# Patient Record
Sex: Male | Born: 1935 | Race: Black or African American | Hispanic: No | Marital: Married | Smoking: Former smoker
Health system: Southern US, Community
[De-identification: ages and names within clinical notes are randomized; demographics above are authoritative.]

## PROBLEM LIST (undated history)

## (undated) DIAGNOSIS — E119 Type 2 diabetes mellitus without complications: Secondary | ICD-10-CM

## (undated) DIAGNOSIS — I10 Essential (primary) hypertension: Secondary | ICD-10-CM

## (undated) DIAGNOSIS — E7800 Pure hypercholesterolemia, unspecified: Secondary | ICD-10-CM

## (undated) HISTORY — PX: CORONARY ANGIOPLASTY WITH STENT PLACEMENT: SHX49

## (undated) HISTORY — PX: EYE SURGERY: SHX253

---

## 2010-04-22 ENCOUNTER — Emergency Department (HOSPITAL_BASED_OUTPATIENT_CLINIC_OR_DEPARTMENT_OTHER): Admission: EM | Admit: 2010-04-22 | Discharge: 2010-04-22 | Payer: Self-pay | Admitting: Emergency Medicine

## 2010-04-22 ENCOUNTER — Ambulatory Visit: Payer: Self-pay | Admitting: Diagnostic Radiology

## 2010-05-14 ENCOUNTER — Ambulatory Visit (HOSPITAL_BASED_OUTPATIENT_CLINIC_OR_DEPARTMENT_OTHER): Admission: RE | Admit: 2010-05-14 | Discharge: 2010-05-14 | Payer: Self-pay | Admitting: Orthopedic Surgery

## 2010-05-14 ENCOUNTER — Ambulatory Visit: Payer: Self-pay | Admitting: Diagnostic Radiology

## 2012-08-17 ENCOUNTER — Encounter (HOSPITAL_BASED_OUTPATIENT_CLINIC_OR_DEPARTMENT_OTHER): Payer: Self-pay | Admitting: *Deleted

## 2012-08-17 ENCOUNTER — Emergency Department (HOSPITAL_BASED_OUTPATIENT_CLINIC_OR_DEPARTMENT_OTHER)
Admission: EM | Admit: 2012-08-17 | Discharge: 2012-08-17 | Disposition: A | Discharge status: Home / Self Care | Payer: Medicare Other | Attending: Emergency Medicine | Admitting: Emergency Medicine

## 2012-08-17 ENCOUNTER — Emergency Department (HOSPITAL_BASED_OUTPATIENT_CLINIC_OR_DEPARTMENT_OTHER): Payer: Medicare Other

## 2012-08-17 DIAGNOSIS — R519 Headache, unspecified: Secondary | ICD-10-CM

## 2012-08-17 DIAGNOSIS — R51 Headache: Secondary | ICD-10-CM | POA: Insufficient documentation

## 2012-08-17 DIAGNOSIS — I10 Essential (primary) hypertension: Secondary | ICD-10-CM | POA: Insufficient documentation

## 2012-08-17 HISTORY — DX: Essential (primary) hypertension: I10

## 2012-08-17 NOTE — ED Notes (Signed)
Pt reports pain on top of head since yesterday- denies injury- tender to touch- skin intact

## 2012-08-17 NOTE — ED Provider Notes (Signed)
History     CSN: [id number]  Arrival date & time 08/17/12  1854   First MD Initiated Contact with Patient 08/17/12 1947      Chief Complaint  Patient presents with  . Pain    (Consider location/radiation/quality/duration/timing/severity/associated sxs/prior treatment) HPI Comments: Patient presents complaining of pain to the top of the head that started yesterday.  He denies any injury or trauma.  There are no visual complaints.  No fevers or neck pain.  Has not tried anything for relief.  Never happened before.  The history is provided by the patient.    Past Medical History  Diagnosis Date  . Hypertension     History reviewed. No pertinent past surgical history.  No family history on file.  History  Substance Use Topics  . Smoking status: Former Games developer  . Smokeless tobacco: Never Used  . Alcohol Use: No     former      Review of Systems  All other systems reviewed and are negative.    Allergies  Review of patient's allergies indicates no known allergies.  Home Medications  No current outpatient prescriptions on file.  BP 167/72  Pulse 78  Temp 98.3 F (36.8 C) (Oral)  Resp 18  Ht 5\' 4"  (1.626 m)  Wt 178 lb (80.74 kg)  BMI 30.55 kg/m2  SpO2 98%  Physical Exam  Nursing note and vitals reviewed. Constitutional: He is oriented to person, place, and time. He appears well-developed and well-nourished. No distress.  HENT:  Head: Normocephalic and atraumatic.  Right Ear: External ear normal.  Left Ear: External ear normal.  Mouth/Throat: Oropharynx is clear and moist.       The patient is tender in the top of his head.  There is no temporal ttp and no visible abnormality as far as rash or erythema.  Eyes: EOM are normal. Pupils are equal, round, and reactive to light.  Neck: Normal range of motion. Neck supple.  Cardiovascular: Normal rate and regular rhythm.   No murmur heard. Pulmonary/Chest: Effort normal and breath sounds normal. No respiratory  distress.  Musculoskeletal: Normal range of motion.  Neurological: He is alert and oriented to person, place, and time. No cranial nerve deficit. Coordination normal.  Skin: Skin is warm and dry. He is not diaphoretic.    ED Course  Procedures (including critical care time)  Labs Reviewed - No data to display No results found.   No diagnosis found.    MDM  The ct looks okay.  I am unsure as to why he is having this discomfort.  Considered are SDH which I believe the ct has ruled out, pre-shingles eruption pain, and temporal arteritis, however he is not tender over the temporal artery, just the top of the head.  He will be discharged to home neurologically intact and give this time to improve or declare itself.          Geoffery Lyons, MD 08/17/12 2123

## 2015-04-11 ENCOUNTER — Encounter (HOSPITAL_BASED_OUTPATIENT_CLINIC_OR_DEPARTMENT_OTHER): Payer: Self-pay

## 2015-04-11 ENCOUNTER — Emergency Department (HOSPITAL_BASED_OUTPATIENT_CLINIC_OR_DEPARTMENT_OTHER): Payer: Medicare PPO

## 2015-04-11 ENCOUNTER — Emergency Department (HOSPITAL_BASED_OUTPATIENT_CLINIC_OR_DEPARTMENT_OTHER)
Admission: EM | Admit: 2015-04-11 | Discharge: 2015-04-11 | Disposition: A | Discharge status: Home / Self Care | Payer: Medicare PPO | Attending: Emergency Medicine | Admitting: Emergency Medicine

## 2015-04-11 DIAGNOSIS — I10 Essential (primary) hypertension: Secondary | ICD-10-CM | POA: Insufficient documentation

## 2015-04-11 DIAGNOSIS — M109 Gout, unspecified: Secondary | ICD-10-CM | POA: Diagnosis not present

## 2015-04-11 DIAGNOSIS — Z87891 Personal history of nicotine dependence: Secondary | ICD-10-CM | POA: Insufficient documentation

## 2015-04-11 DIAGNOSIS — Z9861 Coronary angioplasty status: Secondary | ICD-10-CM | POA: Diagnosis not present

## 2015-04-11 DIAGNOSIS — Z79899 Other long term (current) drug therapy: Secondary | ICD-10-CM | POA: Insufficient documentation

## 2015-04-11 DIAGNOSIS — E780 Pure hypercholesterolemia: Secondary | ICD-10-CM | POA: Diagnosis not present

## 2015-04-11 DIAGNOSIS — E119 Type 2 diabetes mellitus without complications: Secondary | ICD-10-CM | POA: Insufficient documentation

## 2015-04-11 DIAGNOSIS — M79672 Pain in left foot: Secondary | ICD-10-CM | POA: Diagnosis present

## 2015-04-11 HISTORY — DX: Pure hypercholesterolemia, unspecified: E78.00

## 2015-04-11 HISTORY — DX: Type 2 diabetes mellitus without complications: E11.9

## 2015-04-11 LAB — CBC WITH DIFFERENTIAL/PLATELET
Basophils Absolute: 0 10*3/uL (ref 0.0–0.1)
Basophils Relative: 1 % (ref 0–1)
Eosinophils Absolute: 0.5 10*3/uL (ref 0.0–0.7)
Eosinophils Relative: 6 % — ABNORMAL HIGH (ref 0–5)
HCT: 37.8 % — ABNORMAL LOW (ref 39.0–52.0)
Hemoglobin: 12.5 g/dL — ABNORMAL LOW (ref 13.0–17.0)
Lymphocytes Relative: 30 % (ref 12–46)
Lymphs Abs: 2.3 10*3/uL (ref 0.7–4.0)
MCH: 28.3 pg (ref 26.0–34.0)
MCHC: 33.1 g/dL (ref 30.0–36.0)
MCV: 85.7 fL (ref 78.0–100.0)
Monocytes Absolute: 0.8 10*3/uL (ref 0.1–1.0)
Monocytes Relative: 10 % (ref 3–12)
Neutro Abs: 4 10*3/uL (ref 1.7–7.7)
Neutrophils Relative %: 53 % (ref 43–77)
Platelets: 137 10*3/uL — ABNORMAL LOW (ref 150–400)
RBC: 4.41 MIL/uL (ref 4.22–5.81)
RDW: 14.1 % (ref 11.5–15.5)
WBC: 7.7 10*3/uL (ref 4.0–10.5)

## 2015-04-11 LAB — BASIC METABOLIC PANEL
Anion gap: 9 (ref 5–15)
BUN: 29 mg/dL — ABNORMAL HIGH (ref 6–23)
CO2: 22 mmol/L (ref 19–32)
Calcium: 8.2 mg/dL — ABNORMAL LOW (ref 8.4–10.5)
Chloride: 106 mmol/L (ref 96–112)
Creatinine, Ser: 1.78 mg/dL — ABNORMAL HIGH (ref 0.50–1.35)
GFR calc Af Amer: 40 mL/min — ABNORMAL LOW (ref >90)
GFR calc non Af Amer: 35 mL/min — ABNORMAL LOW (ref >90)
Glucose, Bld: 217 mg/dL — ABNORMAL HIGH (ref 70–99)
Potassium: 3.7 mmol/L (ref 3.5–5.1)
Sodium: 137 mmol/L (ref 135–145)

## 2015-04-11 MED ORDER — COLCHICINE 0.6 MG PO TABS: 0.6000 mg | ORAL_TABLET | Freq: Every day | ORAL | Status: DC

## 2015-04-11 MED ORDER — HYDROCODONE-ACETAMINOPHEN 5-325 MG PO TABS
1.0000 | ORAL_TABLET | Freq: Once | ORAL | Status: AC
  Administered 2015-04-11: 1 via ORAL
  Filled 2015-04-11: qty 1

## 2015-04-11 MED ORDER — HYDROCODONE-ACETAMINOPHEN 5-325 MG PO TABS: 1.0000 | ORAL_TABLET | Freq: Four times a day (QID) | ORAL | Status: DC | PRN

## 2015-04-11 NOTE — Discharge Instructions (Signed)
Your Creatinine (measure of kidney function) is 1.78 (elevated). Call your doctor for close follow up to recheck this. The most likely cause of your foot pain is gout, but if this gets worse or swelling worsens it may also be an infection and you should return to the ER immediately     Gout Gout is an inflammatory arthritis caused by a buildup of uric acid crystals in the joints. Uric acid is a chemical that is normally present in the blood. When the level of uric acid in the blood is too high it can form crystals that deposit in your joints and tissues. This causes joint redness, soreness, and swelling (inflammation). Repeat attacks are common. Over time, uric acid crystals can form into masses (tophi) near a joint, destroying bone and causing disfigurement. Gout is treatable and often preventable. CAUSES  The disease begins with elevated levels of uric acid in the blood. Uric acid is produced by your body when it breaks down a naturally found substance called purines. Certain foods you eat, such as meats and fish, contain high amounts of purines. Causes of an elevated uric acid level include:  Being passed down from parent to child (heredity).  Diseases that cause increased uric acid production (such as obesity, psoriasis, and certain cancers).  Excessive alcohol use.  Diet, especially diets rich in meat and seafood.  Medicines, including certain cancer-fighting medicines (chemotherapy), water pills (diuretics), and aspirin.  Chronic kidney disease. The kidneys are no longer able to remove uric acid well.  Problems with metabolism. Conditions strongly associated with gout include:  Obesity.  High blood pressure.  High cholesterol.  Diabetes. Not everyone with elevated uric acid levels gets gout. It is not understood why some people get gout and others do not. Surgery, joint injury, and eating too much of certain foods are some of the factors that can lead to gout attacks. SYMPTOMS     An attack of gout comes on quickly. It causes intense pain with redness, swelling, and warmth in a joint.  Fever can occur.  Often, only one joint is involved. Certain joints are more commonly involved:  Base of the big toe.  Knee.  Ankle.  Wrist.  Finger. Without treatment, an attack usually goes away in a few days to weeks. Between attacks, you usually will not have symptoms, which is different from many other forms of arthritis. DIAGNOSIS  Your caregiver will suspect gout based on your symptoms and exam. In some cases, tests may be recommended. The tests may include:  Blood tests.  Urine tests.  X-rays.  Joint fluid exam. This exam requires a needle to remove fluid from the joint (arthrocentesis). Using a microscope, gout is confirmed when uric acid crystals are seen in the joint fluid. TREATMENT  There are two phases to gout treatment: treating the sudden onset (acute) attack and preventing attacks (prophylaxis).  Treatment of an Acute Attack.  Medicines are used. These include anti-inflammatory medicines or steroid medicines.  An injection of steroid medicine into the affected joint is sometimes necessary.  The painful joint is rested. Movement can worsen the arthritis.  You may use warm or cold treatments on painful joints, depending which works best for you.  Treatment to Prevent Attacks.  If you suffer from frequent gout attacks, your caregiver may advise preventive medicine. These medicines are started after the acute attack subsides. These medicines either help your kidneys eliminate uric acid from your body or decrease your uric acid production. You may need to  stay on these medicines for a very long time.  The early phase of treatment with preventive medicine can be associated with an increase in acute gout attacks. For this reason, during the first few months of treatment, your caregiver may also advise you to take medicines usually used for acute gout  treatment. Be sure you understand your caregiver's directions. Your caregiver may make several adjustments to your medicine dose before these medicines are effective.  Discuss dietary treatment with your caregiver or dietitian. Alcohol and drinks high in sugar and fructose and foods such as meat, poultry, and seafood can increase uric acid levels. Your caregiver or dietitian can advise you on drinks and foods that should be limited. HOME CARE INSTRUCTIONS   Do not take aspirin to relieve pain. This raises uric acid levels.  Only take over-the-counter or prescription medicines for pain, discomfort, or fever as directed by your caregiver.  Rest the joint as much as possible. When in bed, keep sheets and blankets off painful areas.  Keep the affected joint raised (elevated).  Apply warm or cold treatments to painful joints. Use of warm or cold treatments depends on which works best for you.  Use crutches if the painful joint is in your leg.  Drink enough fluids to keep your urine clear or pale yellow. This helps your body get rid of uric acid. Limit alcohol, sugary drinks, and fructose drinks.  Follow your dietary instructions. Pay careful attention to the amount of protein you eat. Your daily diet should emphasize fruits, vegetables, whole grains, and fat-free or low-fat milk products. Discuss the use of coffee, vitamin C, and cherries with your caregiver or dietitian. These may be helpful in lowering uric acid levels.  Maintain a healthy body weight. SEEK MEDICAL CARE IF:   You develop diarrhea, vomiting, or any side effects from medicines.  You do not feel better in 24 hours, or you are getting worse. SEEK IMMEDIATE MEDICAL CARE IF:   Your joint becomes suddenly more tender, and you have chills or a fever. MAKE SURE YOU:   Understand these instructions.  Will watch your condition.  Will get help right away if you are not doing well or get worse. Document Released: 12/11/2000  Document Revised: 04/30/2014 Document Reviewed: 07/27/2012 [facility] Patient Information 2015 [place], [place]. This information is not intended to replace advice given to you by your health care provider. Make sure you discuss any questions you have with your health care provider.

## 2015-04-11 NOTE — ED Notes (Signed)
Left foot pain x today-denies injury-slow steady gait into door-places in w/c

## 2015-04-11 NOTE — ED Provider Notes (Signed)
CSN: [id number]     Arrival date & time 04/11/15  1905 History  This chart was scribed for Pricilla LovelessScott Woods Gangemi, MD by Modena JanskyAlbert Thayil, ED Scribe. This patient was seen in room MH07/MH07 and the patient's care was started at 7:30 PM.   Chief Complaint  Patient presents with  . Foot Pain   The history is provided by the patient. No language interpreter was used.   HPI Comments: Joseph ArtJohn Mclean is a 81 y.o. male who presents to the Emergency Department complaining of constant left foot pain that started this morning. He reports that he woke up this morning with left foot pain, swelling, and redness. He states that ambulation exacerbates the pain. He reports no treatment PTA. He reports no hx of gout or recent alcohol use. He denies any fever.   Past Medical History  Diagnosis Date  . Hypertension   . Diabetes mellitus without complication   . High cholesterol    Past Surgical History  Procedure Laterality Date  . Coronary angioplasty with stent placement     No family history on file. History  Substance Use Topics  . Smoking status: Former Games developermoker  . Smokeless tobacco: Never Used  . Alcohol Use: No    Review of Systems  Constitutional: Negative for fever.  Musculoskeletal: Positive for joint swelling and arthralgias. Negative for back pain.  Skin: Positive for color change. Negative for wound.  Neurological: Negative for weakness and numbness.  All other systems reviewed and are negative.   Allergies  Review of patient's allergies indicates no known allergies.  Home Medications   Prior to Admission medications   Medication Sig Start Date End Date Taking? Authorizing Provider  carvedilol (COREG) 6.25 MG tablet Take 6.25 mg by mouth 2 (two) times daily with a meal.    Historical Provider, MD  GARLIC PO Take 1 tablet by mouth daily.    Historical Provider, MD  glipiZIDE (GLUCOTROL XL) 5 MG 24 hr tablet Take 5 mg by mouth daily.    Historical Provider, MD  lisinopril (PRINIVIL,ZESTRIL) 20  MG tablet Take 20 mg by mouth daily.    Historical Provider, MD  Omega-3 Fatty Acids (FISH OIL PO) Take 1 capsule by mouth daily.    Historical Provider, MD  potassium chloride SA (K-DUR,KLOR-CON) 20 MEQ tablet Take 20 mEq by mouth 2 (two) times daily.    Historical Provider, MD  pravastatin (PRAVACHOL) 40 MG tablet Take 40 mg by mouth daily.    Historical Provider, MD   BP 137/68 mmHg  Pulse 70  Temp(Src) 98.5 F (36.9 C) (Oral)  Resp 20  Ht 5\' 4"  (1.626 m)  Wt 168 lb (76.204 kg)  BMI 28.82 kg/m2  SpO2 98% Physical Exam  Constitutional: He is oriented to person, place, and time. He appears well-developed and well-nourished. No distress.  HENT:  Head: Normocephalic and atraumatic.  Eyes: Right eye exhibits no discharge. Left eye exhibits no discharge.  Neck: No tracheal deviation present.  Cardiovascular: Normal rate and intact distal pulses.   Pulmonary/Chest: Effort normal. No respiratory distress.  Musculoskeletal: Normal range of motion.  TTP (to very light touch) and mild erythema left 1st MTP. Pain with ROM of first toe. DP pulses are +2.   Neurological: He is alert and oriented to person, place, and time.  Skin: Skin is warm and dry. There is erythema.  Psychiatric: He has a normal mood and affect. His behavior is normal.  Nursing note and vitals reviewed.   ED Course  Procedures (  including critical care time) DIAGNOSTIC STUDIES: Oxygen Saturation is 98% on RA, normal by my interpretation.    COORDINATION OF CARE: 7:34 PM- Pt advised of plan for treatment which includes medication, radiology, and labs and pt agrees.  Labs Review Labs Reviewed  CBC WITH DIFFERENTIAL/PLATELET - Abnormal; Notable for the following:    Hemoglobin 12.5 (*)    HCT 37.8 (*)    Platelets 137 (*)    Eosinophils Relative 6 (*)    All other components within normal limits  BASIC METABOLIC PANEL - Abnormal; Notable for the following:    Glucose, Bld 217 (*)    BUN 29 (*)    Creatinine, Ser  1.78 (*)    Calcium 8.2 (*)    GFR calc non Af Amer 35 (*)    GFR calc Af Amer 40 (*)    All other components within normal limits    Imaging Review Dg Foot Complete Left  04/11/2015   CLINICAL DATA:  Left foot pain at the 1st MTP joint  EXAM: LEFT FOOT - COMPLETE 3+ VIEW  COMPARISON:  None.  FINDINGS: No fracture or dislocation is seen.  Mild degenerative changes of the 1st MTP joint.  The visualized soft tissues are unremarkable.  IMPRESSION: No fracture or dislocation is seen.  Mild degenerative changes at the 1st MTP joint.   Electronically Signed   By: Charline Bills M.D.   On: 04/11/2015 20:18     EKG Interpretation None      MDM   Final diagnoses:  Acute gout of left foot, unspecified cause    Patient's pain is most likely from gout. Given his creatinine (no baseline - does not appear dehydrated) will hold on NSAIDs. Will give pain meds and colchicine. I discussed that it could be possibly septic arthritis (though I highly doubt with normal WBC, full but painful ROM) but patient declines arthrocentesis. Discussed warning signs and return precautions, will f/u with PCP.  I personally performed the services described in this documentation, which was scribed in my presence. The recorded information has been reviewed and is accurate.     Pricilla Loveless, MD 04/12/15 [phone]

## 2015-06-17 ENCOUNTER — Emergency Department (HOSPITAL_BASED_OUTPATIENT_CLINIC_OR_DEPARTMENT_OTHER)
Admission: EM | Admit: 2015-06-17 | Discharge: 2015-06-17 | Disposition: A | Discharge status: Home / Self Care | Payer: Medicare PPO | Attending: Emergency Medicine | Admitting: Emergency Medicine

## 2015-06-17 ENCOUNTER — Encounter (HOSPITAL_BASED_OUTPATIENT_CLINIC_OR_DEPARTMENT_OTHER): Payer: Self-pay | Admitting: *Deleted

## 2015-06-17 DIAGNOSIS — Z79899 Other long term (current) drug therapy: Secondary | ICD-10-CM | POA: Diagnosis not present

## 2015-06-17 DIAGNOSIS — Z9861 Coronary angioplasty status: Secondary | ICD-10-CM | POA: Insufficient documentation

## 2015-06-17 DIAGNOSIS — E780 Pure hypercholesterolemia: Secondary | ICD-10-CM | POA: Insufficient documentation

## 2015-06-17 DIAGNOSIS — E119 Type 2 diabetes mellitus without complications: Secondary | ICD-10-CM | POA: Insufficient documentation

## 2015-06-17 DIAGNOSIS — Z87891 Personal history of nicotine dependence: Secondary | ICD-10-CM | POA: Insufficient documentation

## 2015-06-17 DIAGNOSIS — R600 Localized edema: Secondary | ICD-10-CM | POA: Insufficient documentation

## 2015-06-17 DIAGNOSIS — I10 Essential (primary) hypertension: Secondary | ICD-10-CM | POA: Diagnosis not present

## 2015-06-17 DIAGNOSIS — M7989 Other specified soft tissue disorders: Secondary | ICD-10-CM | POA: Diagnosis present

## 2015-06-17 LAB — BASIC METABOLIC PANEL
Anion gap: 12 (ref 5–15)
BUN: 26 mg/dL — ABNORMAL HIGH (ref 6–20)
CO2: 24 mmol/L (ref 22–32)
Calcium: 8.4 mg/dL — ABNORMAL LOW (ref 8.9–10.3)
Chloride: 103 mmol/L (ref 101–111)
Creatinine, Ser: 1.77 mg/dL — ABNORMAL HIGH (ref 0.61–1.24)
GFR calc Af Amer: 40 mL/min — ABNORMAL LOW (ref >60)
GFR calc non Af Amer: 35 mL/min — ABNORMAL LOW (ref >60)
Glucose, Bld: 149 mg/dL — ABNORMAL HIGH (ref 65–99)
Potassium: 3.4 mmol/L — ABNORMAL LOW (ref 3.5–5.1)
Sodium: 139 mmol/L (ref 135–145)

## 2015-06-17 LAB — CBC WITH DIFFERENTIAL/PLATELET
Basophils Absolute: 0 10*3/uL (ref 0.0–0.1)
Basophils Relative: 0 % (ref 0–1)
Eosinophils Absolute: 0.3 10*3/uL (ref 0.0–0.7)
Eosinophils Relative: 5 % (ref 0–5)
HCT: 38.5 % — ABNORMAL LOW (ref 39.0–52.0)
Hemoglobin: 12.8 g/dL — ABNORMAL LOW (ref 13.0–17.0)
Lymphocytes Relative: 31 % (ref 12–46)
Lymphs Abs: 2.4 10*3/uL (ref 0.7–4.0)
MCH: 28.8 pg (ref 26.0–34.0)
MCHC: 33.2 g/dL (ref 30.0–36.0)
MCV: 86.5 fL (ref 78.0–100.0)
Monocytes Absolute: 0.9 10*3/uL (ref 0.1–1.0)
Monocytes Relative: 12 % (ref 3–12)
Neutro Abs: 4 10*3/uL (ref 1.7–7.7)
Neutrophils Relative %: 52 % (ref 43–77)
Platelets: 150 10*3/uL (ref 150–400)
RBC: 4.45 MIL/uL (ref 4.22–5.81)
RDW: 14 % (ref 11.5–15.5)
WBC: 7.6 10*3/uL (ref 4.0–10.5)

## 2015-06-17 LAB — BRAIN NATRIURETIC PEPTIDE: B Natriuretic Peptide: 42.3 pg/mL (ref 0.0–100.0)

## 2015-06-17 MED ORDER — FUROSEMIDE 20 MG PO TABS
ORAL_TABLET | ORAL | Status: DC
Start: 2015-06-17 — End: 2015-06-18
  Filled 2015-06-17: qty 1

## 2015-06-17 MED ORDER — FUROSEMIDE 20 MG PO TABS: 20.0000 mg | ORAL_TABLET | Freq: Every day | ORAL | Status: DC

## 2015-06-17 MED ORDER — FUROSEMIDE 20 MG PO TABS
20.0000 mg | ORAL_TABLET | Freq: Once | ORAL | Status: AC
  Administered 2015-06-17: 20 mg via ORAL
  Filled 2015-06-17: qty 1

## 2015-06-17 NOTE — ED Provider Notes (Signed)
CSN: [id number]     Arrival date & time 06/17/15  1856 History  This chart was scribed for Gwyneth Sprout, MD by Octavia Heir, ED Scribe. This patient was seen in room MHT13/MHT13 and the patient's care was started at 9:02 PM.     Chief Complaint  Patient presents with  . Leg Swelling      The history is provided by the patient. No language interpreter was used.    HPI Comments: Juan Boyd is a 81 y.o. male who has a hx of DM and HTNpresents to the Emergency Department complaining of bilateral leg swelling onset 2 days ago. He reports not having swelling of legs before. Pt notes he does have a stent in his heart. He states he went his PCP last week and they told him to follow up for his kidneys.  Pt denies pain, taking any new medication, shortness of breath, chest pain  Past Medical History  Diagnosis Date  . Hypertension   . Diabetes mellitus without complication   . High cholesterol    Past Surgical History  Procedure Laterality Date  . Coronary angioplasty with stent placement    . Eye surgery     No family history on file. History  Substance Use Topics  . Smoking status: Former Games developer  . Smokeless tobacco: Never Used  . Alcohol Use: No    Review of Systems  A complete 10 system review of systems was obtained and all systems are negative except as noted in the HPI and PMH.    Allergies  Review of patient's allergies indicates no known allergies.  Home Medications   Prior to Admission medications   Medication Sig Start Date End Date Taking? Authorizing Provider  carvedilol (COREG) 6.25 MG tablet Take 6.25 mg by mouth 2 (two) times daily with a meal.    Historical Provider, MD  colchicine 0.6 MG tablet Take 1 tablet (0.6 mg total) by mouth daily. 04/11/15   Pricilla Loveless, MD  GARLIC PO Take 1 tablet by mouth daily.    Historical Provider, MD  glipiZIDE (GLUCOTROL XL) 5 MG 24 hr tablet Take 5 mg by mouth daily.    Historical Provider, MD  HYDROcodone-acetaminophen  (NORCO) 5-325 MG per tablet Take 1 tablet by mouth every 6 (six) hours as needed for severe pain. 04/11/15   Pricilla Loveless, MD  lisinopril (PRINIVIL,ZESTRIL) 20 MG tablet Take 20 mg by mouth daily.    Historical Provider, MD  Omega-3 Fatty Acids (FISH OIL PO) Take 1 capsule by mouth daily.    Historical Provider, MD  potassium chloride SA (K-DUR,KLOR-CON) 20 MEQ tablet Take 20 mEq by mouth 2 (two) times daily.    Historical Provider, MD  pravastatin (PRAVACHOL) 40 MG tablet Take 40 mg by mouth daily.    Historical Provider, MD   Triage vitals: BP 135/71 mmHg  Pulse 64  Temp(Src) 98.1 F (36.7 C) (Oral)  Resp 16  Ht  (1.626 m)  Wt 170 lb (77.111 kg)  BMI 29.17 kg/m2  SpO2 99% Physical Exam  Constitutional: He is oriented to person, place, and time. He appears well-developed and well-nourished. No distress.  HENT:  Head: Normocephalic.  Eyes: Conjunctivae are normal. Pupils are equal, round, and reactive to light. No scleral icterus.  Neck: Normal range of motion. Neck supple. No thyromegaly present.  Cardiovascular: Normal rate and regular rhythm.  Exam reveals no gallop and no friction rub.   No murmur heard. Pulmonary/Chest: Effort normal and breath sounds normal. No  respiratory distress. He has no wheezes. He has no rales.  Abdominal: Soft. Bowel sounds are normal. He exhibits no distension. There is no tenderness. There is no rebound.  Musculoskeletal: Normal range of motion.  Neurological: He is alert and oriented to person, place, and time.  3+ pedal edema to the knee  Skin: Skin is warm and dry. No rash noted.  Psychiatric: He has a normal mood and affect. His behavior is normal.  Nursing note and vitals reviewed.   ED Course  Procedures  DIAGNOSTIC STUDIES: Oxygen Saturation is 99% on RA, normal by my interpretation.  COORDINATION OF CARE:  9:05 PM Discussed treatment plan which includes check kidney function and weight with pt at bedside and pt agreed to plan..    Labs Review Labs Reviewed  CBC WITH DIFFERENTIAL/PLATELET - Abnormal; Notable for the following:    Hemoglobin 12.8 (*)    HCT 38.5 (*)    All other components within normal limits  BASIC METABOLIC PANEL - Abnormal; Notable for the following:    Potassium 3.4 (*)    Glucose, Bld 149 (*)    BUN 26 (*)    Creatinine, Ser 1.77 (*)    Calcium 8.4 (*)    GFR calc non Af Amer 35 (*)    GFR calc Af Amer 40 (*)    All other components within normal limits  BRAIN NATRIURETIC PEPTIDE    Imaging Review No results found.   EKG Interpretation None      MDM   Final diagnoses:  Bilateral edema of lower extremity   patient with worsening lower extremity edema over the last few days. He denies any chest pain or shortness of breath. No prior history of similar. He does have a history of chronic renal insufficiency and is due to see his kidney doctor on Wednesday. He's never had lower extremity edema before. Patient has a 6 pound noted weight gain in the last 1 week. He is well-appearing on exam. He is no longer taking colchicine and denies taking excessive amounts of ibuprofen.   Labs today show a stable creatinine of 1.7. BMP within normal limits and no other acute findings. Patient given 2 days worth of Lasix and he will follow-up with his regular doctor. He denies any recent medication changes.   I personally performed the services described in this documentation, which was scribed in my presence.  The recorded information has been reviewed and considered.   Gwyneth Sprout, MD 06/17/15 [phone]

## 2015-06-17 NOTE — Discharge Instructions (Signed)
Peripheral Edema °You have swelling in your legs (peripheral edema). This swelling is due to excess accumulation of salt and water in your body. Edema may be a sign of heart, kidney or liver disease, or a side effect of a medication. It may also be due to problems in the leg veins. Elevating your legs and using special support stockings may be very helpful, if the cause of the swelling is due to poor venous circulation. Avoid long periods of standing, whatever the cause. °Treatment of edema depends on identifying the cause. Chips, pretzels, pickles and other salty foods should be avoided. Restricting salt in your diet is almost always needed. Water pills (diuretics) are often used to remove the excess salt and water from your body via urine. These medicines prevent the kidney from reabsorbing sodium. This increases urine flow. °Diuretic treatment may also result in lowering of potassium levels in your body. Potassium supplements may be needed if you have to use diuretics daily. Daily weights can help you keep track of your progress in clearing your edema. You should call your caregiver for follow up care as recommended. °SEEK IMMEDIATE MEDICAL CARE IF:  °· You have increased swelling, pain, redness, or heat in your legs. °· You develop shortness of breath, especially when lying down. °· You develop chest or abdominal pain, weakness, or fainting. °· You have a fever. °Document Released: 01/21/2005 Document Revised: 03/07/2012 Document Reviewed: 01/01/2010 °[employer]® Patient Information ©2015 [employer]. This information is not intended to replace advice given to you by your health care provider. Make sure you discuss any questions you have with your health care provider. ° °

## 2015-06-17 NOTE — ED Notes (Signed)
Legs swelling x 2 days. No SOB. Denies pain.

## 2016-01-25 ENCOUNTER — Emergency Department (HOSPITAL_BASED_OUTPATIENT_CLINIC_OR_DEPARTMENT_OTHER)
Admission: EM | Admit: 2016-01-25 | Discharge: 2016-01-25 | Disposition: A | Discharge status: Home / Self Care | Payer: Medicare Other | Attending: Emergency Medicine | Admitting: Emergency Medicine

## 2016-01-25 ENCOUNTER — Encounter (HOSPITAL_BASED_OUTPATIENT_CLINIC_OR_DEPARTMENT_OTHER): Payer: Self-pay | Admitting: *Deleted

## 2016-01-25 ENCOUNTER — Emergency Department (HOSPITAL_BASED_OUTPATIENT_CLINIC_OR_DEPARTMENT_OTHER): Payer: Medicare Other

## 2016-01-25 DIAGNOSIS — Z7984 Long term (current) use of oral hypoglycemic drugs: Secondary | ICD-10-CM | POA: Diagnosis not present

## 2016-01-25 DIAGNOSIS — D72829 Elevated white blood cell count, unspecified: Secondary | ICD-10-CM | POA: Diagnosis not present

## 2016-01-25 DIAGNOSIS — R05 Cough: Secondary | ICD-10-CM | POA: Diagnosis not present

## 2016-01-25 DIAGNOSIS — Z87891 Personal history of nicotine dependence: Secondary | ICD-10-CM | POA: Insufficient documentation

## 2016-01-25 DIAGNOSIS — E7800 Pure hypercholesterolemia, unspecified: Secondary | ICD-10-CM | POA: Diagnosis not present

## 2016-01-25 DIAGNOSIS — E119 Type 2 diabetes mellitus without complications: Secondary | ICD-10-CM | POA: Diagnosis not present

## 2016-01-25 DIAGNOSIS — R252 Cramp and spasm: Secondary | ICD-10-CM | POA: Insufficient documentation

## 2016-01-25 DIAGNOSIS — I10 Essential (primary) hypertension: Secondary | ICD-10-CM | POA: Diagnosis not present

## 2016-01-25 DIAGNOSIS — M79652 Pain in left thigh: Secondary | ICD-10-CM | POA: Diagnosis not present

## 2016-01-25 DIAGNOSIS — Z79899 Other long term (current) drug therapy: Secondary | ICD-10-CM | POA: Insufficient documentation

## 2016-01-25 DIAGNOSIS — M79651 Pain in right thigh: Secondary | ICD-10-CM | POA: Diagnosis present

## 2016-01-25 LAB — CBC WITH DIFFERENTIAL/PLATELET
Basophils Absolute: 0 10*3/uL (ref 0.0–0.1)
Basophils Relative: 0 %
Eosinophils Absolute: 0.2 10*3/uL (ref 0.0–0.7)
Eosinophils Relative: 2 %
HCT: 39.5 % (ref 39.0–52.0)
Hemoglobin: 12.5 g/dL — ABNORMAL LOW (ref 13.0–17.0)
Lymphocytes Relative: 21 %
Lymphs Abs: 2.5 10*3/uL (ref 0.7–4.0)
MCH: 26.6 pg (ref 26.0–34.0)
MCHC: 31.6 g/dL (ref 30.0–36.0)
MCV: 84 fL (ref 78.0–100.0)
Monocytes Absolute: 1 10*3/uL (ref 0.1–1.0)
Monocytes Relative: 9 %
Neutro Abs: 8.3 10*3/uL — ABNORMAL HIGH (ref 1.7–7.7)
Neutrophils Relative %: 68 %
Platelets: 190 10*3/uL (ref 150–400)
RBC: 4.7 MIL/uL (ref 4.22–5.81)
RDW: 15.7 % — ABNORMAL HIGH (ref 11.5–15.5)
WBC: 12.1 10*3/uL — ABNORMAL HIGH (ref 4.0–10.5)

## 2016-01-25 LAB — URINALYSIS, ROUTINE W REFLEX MICROSCOPIC
Bilirubin Urine: NEGATIVE
Glucose, UA: NEGATIVE mg/dL
Hgb urine dipstick: NEGATIVE
Ketones, ur: NEGATIVE mg/dL
Leukocytes, UA: NEGATIVE
Nitrite: NEGATIVE
Protein, ur: NEGATIVE mg/dL
Specific Gravity, Urine: 1.02 (ref 1.005–1.030)
pH: 5.5 (ref 5.0–8.0)

## 2016-01-25 LAB — BASIC METABOLIC PANEL
Anion gap: 11 (ref 5–15)
BUN: 37 mg/dL — ABNORMAL HIGH (ref 6–20)
CO2: 23 mmol/L (ref 22–32)
Calcium: 8.1 mg/dL — ABNORMAL LOW (ref 8.9–10.3)
Chloride: 107 mmol/L (ref 101–111)
Creatinine, Ser: 1.83 mg/dL — ABNORMAL HIGH (ref 0.61–1.24)
GFR calc Af Amer: 38 mL/min — ABNORMAL LOW (ref >60)
GFR calc non Af Amer: 33 mL/min — ABNORMAL LOW (ref >60)
Glucose, Bld: 121 mg/dL — ABNORMAL HIGH (ref 65–99)
Potassium: 4.3 mmol/L (ref 3.5–5.1)
Sodium: 141 mmol/L (ref 135–145)

## 2016-01-25 LAB — CBG MONITORING, ED: Glucose-Capillary: 117 mg/dL — ABNORMAL HIGH (ref 65–99)

## 2016-01-25 NOTE — Discharge Instructions (Signed)
Return to the ED with any concerns including leg swelling, difficulty breathing, chest pain, decreased level of alertness/lethargy, or any other alarming symptoms

## 2016-01-25 NOTE — ED Notes (Signed)
Patient is alert and oriented x4.  He is complaining of leg cramps that started last night. Patient states that he has had these cramps before and has been taking potassium but Has never had cramps this bad.  Currently he denies any pain but when the pain comes  It rates it 8 of 10.

## 2016-01-25 NOTE — ED Provider Notes (Signed)
CSN: [id number]     Arrival date & time 01/25/16  1741 History  By signing my name below, I, Bethel Born, attest that this documentation has been prepared under the direction and in the presence of Jerelyn Scott, MD. Electronically Signed: Bethel Born, ED Scribe. 01/25/2016. 7:10 PM   Chief Complaint  Patient presents with  . Leg Pain    cramp   Patient is a 82 y.o. male presenting with leg pain. The history is provided by the patient. No language interpreter was used.  Leg Pain Location:  Leg Leg location:  L upper leg and R upper leg Pain details:    Quality:  Cramping   Radiates to:  Does not radiate   Severity:  Severe   Onset quality:  Gradual   Timing:  Constant   Progression:  Unchanged Dislocation: no   Foreign body present:  No foreign bodies Prior injury to area:  No Relieved by:  Nothing Worsened by:  Nothing tried Ineffective treatments:  None tried Associated symptoms: no fever    Juan Boyd is a 82 y.o. male with history of DM, HTN, and high cholesterol  who presents to the Emergency Department complaining of intermittent, 8/10 in severity, cramping bilateral, posterior thigh pain with onset today. The ain has no identified trigger or modifying factors.  Pt states that he has had similar pain in the past that was less severe. He has had a cough for 1 week. Pt denies fever, LE swelling, foot pain, and knee pain.   Past Medical History  Diagnosis Date  . Hypertension   . Diabetes mellitus without complication (HCC)   . High cholesterol    Past Surgical History  Procedure Laterality Date  . Coronary angioplasty with stent placement    . Eye surgery     History reviewed. No pertinent family history. Social History  Substance Use Topics  . Smoking status: Former Games developer  . Smokeless tobacco: Never Used  . Alcohol Use: No    Review of Systems  Constitutional: Negative for fever.  Respiratory: Positive for cough.   Cardiovascular: Negative for leg  swelling.  Musculoskeletal: Positive for myalgias. Negative for arthralgias.  All other systems reviewed and are negative.  Allergies  Review of patient's allergies indicates no known allergies.  Home Medications   Prior to Admission medications   Medication Sig Start Date End Date Taking? Authorizing Provider  carvedilol (COREG) 6.25 MG tablet Take 6.25 mg by mouth 2 (two) times daily with a meal.    Historical Provider, MD  colchicine 0.6 MG tablet Take 1 tablet (0.6 mg total) by mouth daily. 04/11/15   Pricilla Loveless, MD  furosemide (LASIX) 20 MG tablet Take 1 tablet (20 mg total) by mouth daily. 06/17/15   Gwyneth Sprout, MD  GARLIC PO Take 1 tablet by mouth daily.    Historical Provider, MD  glipiZIDE (GLUCOTROL XL) 5 MG 24 hr tablet Take 5 mg by mouth daily.    Historical Provider, MD  HYDROcodone-acetaminophen (NORCO) 5-325 MG per tablet Take 1 tablet by mouth every 6 (six) hours as needed for severe pain. 04/11/15   Pricilla Loveless, MD  lisinopril (PRINIVIL,ZESTRIL) 20 MG tablet Take 20 mg by mouth daily.    Historical Provider, MD  Omega-3 Fatty Acids (FISH OIL PO) Take 1 capsule by mouth daily.    Historical Provider, MD  potassium chloride SA (K-DUR,KLOR-CON) 20 MEQ tablet Take 20 mEq by mouth 2 (two) times daily.    Historical Provider, MD  pravastatin (PRAVACHOL) 40 MG tablet Take 40 mg by mouth daily.    Historical Provider, MD   BP 147/72 mmHg  Pulse 72  Temp(Src) 97.7 F (36.5 C) (Oral)  Resp 20  Wt 176 lb (79.833 kg)  SpO2 98% Vitals reviewed Physical Exam  Physical Examination: General appearance - alert, well appearing, and in no distress Mental status - alert, oriented to person, place, and time Eyes -  No scleral icterus, no conjunctival injection Chest - clear to auscultation, no wheezes, rales or rhonchi, symmetric air entry Heart - normal rate, regular rhythm, normal S1, S2, no murmurs, rubs, clicks or gallops Abdomen - soft, nontender, nondistended, no  masses or organomegaly Back exam - full range of motion, no tenderness, palpable spasm or pain on motion Neurological - alert, oriented, normal speech Musculoskeletal - no joint tenderness, deformity or swelling Extremities - peripheral pulses normal, no pedal edema, no clubbing or cyanosis Skin - normal coloration and turgor, no rashes  ED Course  Procedures (including critical care time) DIAGNOSTIC STUDIES: Oxygen Saturation is 98% on RA,  normal by my interpretation.    COORDINATION OF CARE: 7:09 PM Discussed treatment plan which includes lab work and CXR with pt at bedside and pt agreed to plan. Labs Review Labs Reviewed  CBC WITH DIFFERENTIAL/PLATELET - Abnormal; Notable for the following:    WBC 12.1 (*)    Hemoglobin 12.5 (*)    RDW 15.7 (*)    Neutro Abs 8.3 (*)    All other components within normal limits  BASIC METABOLIC PANEL - Abnormal; Notable for the following:    Glucose, Bld 121 (*)    BUN 37 (*)    Creatinine, Ser 1.83 (*)    Calcium 8.1 (*)    GFR calc non Af Amer 33 (*)    GFR calc Af Amer 38 (*)    All other components within normal limits  CBG MONITORING, ED - Abnormal; Notable for the following:    Glucose-Capillary 117 (*)    All other components within normal limits  URINALYSIS, ROUTINE W REFLEX MICROSCOPIC (NOT AT [facility])    Imaging Review Dg Chest 2 View  01/25/2016  CLINICAL DATA:  One week history of cough. EXAM: CHEST  2 VIEW COMPARISON:  None. FINDINGS: The heart size and mediastinal contours are within normal limits. Both lungs are clear. The visualized skeletal structures are unremarkable. IMPRESSION: No active cardiopulmonary disease. Electronically Signed   By: Kennith Center M.D.   On: 01/25/2016 19:41   I have personally reviewed and evaluated these images and lab results as part of my medical decision-making.   EKG Interpretation None      MDM   Final diagnoses:  Cramp of both lower extremities    Pt presenting with c/o posterior  thigh cramping, electrolytes are reassuring, mild leukocytosis- CXR and urinalysis are reassuring.  Doubt DVT or other acute abnormality at this time.   Discharged with strict return precautions.  Pt agreeable with plan.  I personally performed the services described in this documentation, which was scribed in my presence. The recorded information has been reviewed and is accurate.     Jerelyn Scott, MD 01/25/16 2031

## 2017-01-28 NOTE — Progress Notes (Signed)
 January 28, 2017  PCP:  Ozell JINNY Lenis, MD  Referring Physician: Ozell Norleen Lenis , MD  Patient ID: Juan Boyd is a 83 y.o. male.  Assessment and Plan:    1. CAD s/p OM stent 01/2014  - doing well, no angina pain, stays active.  - cont with current treatment except d/c plavix   2. DM - followed by PCP  3, CKD stage 3 - baseline creatine is about 1.8.  - most recent labs reviewed via care everywhere  4. Dyslipidemia - on crestor , managed by PCP. No recent labs to review  F/u yearly   Portions of this note were dictated using DRAGON voice recognition software. Please disregard any errors in transcription.   Subjective:    GYASI HAZZARD returns for follow-up after 3 years absent from our clinic.  He is back mainly for refills. Patient is a 83 years old male with known coronary artery disease status post OM stent on February 2015, diabetic mellitus and chronic kidney disease stage III and dyslipidemia. He tell me since last visit here 3 years ago, he has been feeling very well. He continued to work and stays very active. No chest pain or shortness breath.  Review of Systems   CONSTITUTIONAL:  As per HPI. HEENT:  Eyes:  No diplopia or blurred vision. ENT:  No earache, sore throat or runny nose. CARDIOVASCULAR:  No pressure, squeezing, strangling, tightness, heaviness or aching about the chest, neck, axilla or epigastrium. RESPIRATORY:  No cough, shortness of breath, PND or orthopnea. GASTROINTESTINAL:  No nausea, vomiting or diarrhea. GENITOURINARY:  No dysuria, frequency or urgency. MUSCULOSKELETAL:  As per HPI. SKIN:  No change in skin, hair or nails. NEUROLOGIC:  No paresthesias, fasciculations, seizures or weakness. PSYCHIATRIC:  No disorder of thought or mood. ENDOCRINE:  No heat or cold intolerance, polyuria or polydipsia. HEMATOLOGICAL:  No easy bruising or bleeding.     Medical History  There is no problem list on file for this patient.   Social  History  History  Smoking Status  . Never Smoker  Smokeless Tobacco  . Never Used   History  Alcohol Use No   History  Drug Use No     Family History: No family history on file.  Past Surgical History: Past Surgical History:  Procedure Laterality Date  . PR REMV CATARACT EXTRACAP,INSERT LENS Right 05/21/2015   Procedure: EXTRACAPSULAR CATARACT REMOVAL W/INSERTION OF INTRAOCULAR LENS PROSTHESIS, MANUAL OR MECHANICAL TECHNIQUE;  Surgeon: Lynwood Arthea Plover, MD;  Location: [facility];  Service: Ophthalmology  . PR REMV CATARACT EXTRACAP,INSERT LENS Left 06/11/2015   Procedure: EXTRACAPSULAR CATARACT REMOVAL W/INSERTION OF INTRAOCULAR LENS PROSTHESIS, MANUAL OR MECHANICAL TECHNIQUE;  Surgeon: Lynwood Arthea Plover, MD;  Location: [facility];  Service: Ophthalmology     Medications  Current Outpatient Prescriptions  Medication Sig Dispense Refill  . amLODIPine  (NORVASC ) 2.5 MG tablet Take 2.5 mg by mouth.    . aspirin  (ECOTRIN) 81 MG tablet Take 81 mg by mouth daily.    . carvedilol  (COREG ) 12.5 MG tablet Take 1 tablet (12.5 mg total) by mouth Two (2) times a day. 60 tablet 0  . clopidogrel  (PLAVIX ) 75 mg tablet TAKE ONE TABLET BY MOUTH ONCE DAILY 30 tablet 0  . furosemide  (LASIX ) 20 MG tablet Take 20 mg by mouth.    SABRA glipiZIDE (GLUCOTROL) 5 MG tablet Take 5 mg by mouth.    SABRA lisinopril (PRINIVIL,ZESTRIL) 20 MG tablet Take 20 mg by mouth.    SABRA  loratadine (CLARITIN) 10 mg tablet Take 1 tablet (10 mg total) by mouth daily. for 10 days 10 tablet 0  . methocarbamol  (ROBAXIN ) 500 MG tablet Take 1 tablet (500 mg total) by mouth Two (2) times a day. 20 tablet 0  . nitroglycerin (NITROSTAT) 0.4 MG SL tablet Place 0.4 mg under the tongue every five (5) minutes as needed for chest pain.    SABRA OMEGA-3/DHA/EPA/FISH OIL (FISH OIL-OMEGA-3 FATTY ACIDS) 300-1,000 mg capsule Take 2 g by mouth daily.    SABRA omeprazole (PRILOSEC) 20 MG capsule Take 20 mg by mouth daily.    . potassium chloride SA  (K-DUR,KLOR-CON) 20 MEQ tablet Take 20 mEq by mouth daily.    . rosuvastatin  (CRESTOR ) 40 MG tablet TAKE ONE TABLET BY MOUTH ONCE DAILY    . calcium -vitamin D 500 mg(1,250mg ) -200 unit per tablet Take 1 tablet by mouth 2 (two) times a day with meals.    . influenza vaccine, high dose syringe (FLUZONE) (60YR+) 2017-18      No current facility-administered medications for this visit.     Past Meds Medications Discontinued During This Encounter  Medication Reason  . benzonatate (TESSALON) 100 MG capsule Therapy completed  . clopidogrel  (PLAVIX ) 75 mg tablet Duplicate order  . ferrous sulfate 325 (65 FE) MG tablet Therapy completed  . potassium chloride (KLOR-CON) 20 mEq packet Duplicate order    Allergies  Allergies  Allergen Reactions  . Esomeprazole Magnesium Anxiety  . Oxycodone  Nausea Only     Objective:    Vital Signs  BP 168/62   Pulse 64   Ht 162.6 cm (5' 4)   Wt 77.1 kg (170 lb)   BMI 29.18 kg/m  Body mass index is 29.18 kg/m.  Wt Readings from Last 3 Encounters:  01/28/17 77.1 kg (170 lb)  01/20/17 76.2 kg (168 lb)  10/11/16 76.9 kg (169 lb 9.6 oz)    Physical Exam    GENERAL: Appears well. NAD. A+O X 3  HEENT: Normocephalic, Anicteric.  NECK: supple, Trachea midline, Carotid upstrokes normal No JVD  RESPIRATORY: Good breath sound.  No wheezing or Rhonchi heard COR: Normal precordium regular rhythm. Normal S1 and S2.  No murmur/rub, or gallops heard.  ABDOMEN: + BS, Soft, Non tender.  VASCULAR: Normal radial,  posterior tibial pulses bilaterally  EXTREMITIES: No cyanosis or clubbing. No edema of lower extremities.  NEURO: normal mental status, no gross focal motor deficit.  SKIN no rash  MUSCLE -SKELETAL :  full range of motiont  PSYCH: normal behavior, appropriate.    Labs:  No results found for: NA, K, MG, BUN, CREATININE, CHOL, TRIG, HDL, LDL, HGB, HCT, PLT No results found for: INR, PROTIME  JINNY Jodie Pelt, MD [facility] 01/28/2017, 11:01 AM

## 2019-08-08 ENCOUNTER — Emergency Department (HOSPITAL_BASED_OUTPATIENT_CLINIC_OR_DEPARTMENT_OTHER): Payer: Medicare Other

## 2019-08-08 ENCOUNTER — Emergency Department (HOSPITAL_BASED_OUTPATIENT_CLINIC_OR_DEPARTMENT_OTHER)
Admission: EM | Admit: 2019-08-08 | Discharge: 2019-08-08 | Disposition: A | Discharge status: Home / Self Care | Payer: Medicare Other | Attending: Emergency Medicine | Admitting: Emergency Medicine

## 2019-08-08 ENCOUNTER — Encounter (HOSPITAL_BASED_OUTPATIENT_CLINIC_OR_DEPARTMENT_OTHER): Payer: Self-pay | Admitting: Emergency Medicine

## 2019-08-08 ENCOUNTER — Other Ambulatory Visit: Payer: Self-pay

## 2019-08-08 DIAGNOSIS — Z79899 Other long term (current) drug therapy: Secondary | ICD-10-CM | POA: Diagnosis not present

## 2019-08-08 DIAGNOSIS — R112 Nausea with vomiting, unspecified: Secondary | ICD-10-CM

## 2019-08-08 DIAGNOSIS — J069 Acute upper respiratory infection, unspecified: Secondary | ICD-10-CM

## 2019-08-08 DIAGNOSIS — U071 COVID-19: Secondary | ICD-10-CM | POA: Insufficient documentation

## 2019-08-08 DIAGNOSIS — I10 Essential (primary) hypertension: Secondary | ICD-10-CM | POA: Diagnosis not present

## 2019-08-08 DIAGNOSIS — Z7902 Long term (current) use of antithrombotics/antiplatelets: Secondary | ICD-10-CM | POA: Diagnosis not present

## 2019-08-08 DIAGNOSIS — Z87891 Personal history of nicotine dependence: Secondary | ICD-10-CM | POA: Insufficient documentation

## 2019-08-08 DIAGNOSIS — Z7982 Long term (current) use of aspirin: Secondary | ICD-10-CM | POA: Diagnosis not present

## 2019-08-08 DIAGNOSIS — R05 Cough: Secondary | ICD-10-CM | POA: Diagnosis present

## 2019-08-08 DIAGNOSIS — E119 Type 2 diabetes mellitus without complications: Secondary | ICD-10-CM | POA: Insufficient documentation

## 2019-08-08 DIAGNOSIS — Z7984 Long term (current) use of oral hypoglycemic drugs: Secondary | ICD-10-CM | POA: Insufficient documentation

## 2019-08-08 LAB — CBC WITH DIFFERENTIAL/PLATELET
Abs Immature Granulocytes: 0.02 10*3/uL (ref 0.00–0.07)
Basophils Absolute: 0 10*3/uL (ref 0.0–0.1)
Basophils Relative: 0 %
Eosinophils Absolute: 0 10*3/uL (ref 0.0–0.5)
Eosinophils Relative: 0 %
HCT: 39.4 % (ref 39.0–52.0)
Hemoglobin: 12.6 g/dL — ABNORMAL LOW (ref 13.0–17.0)
Immature Granulocytes: 0 %
Lymphocytes Relative: 23 %
Lymphs Abs: 1.8 10*3/uL (ref 0.7–4.0)
MCH: 27.8 pg (ref 26.0–34.0)
MCHC: 32 g/dL (ref 30.0–36.0)
MCV: 87 fL (ref 80.0–100.0)
Monocytes Absolute: 0.8 10*3/uL (ref 0.1–1.0)
Monocytes Relative: 10 %
Neutro Abs: 5.1 10*3/uL (ref 1.7–7.7)
Neutrophils Relative %: 67 %
Platelets: 117 10*3/uL — ABNORMAL LOW (ref 150–400)
RBC: 4.53 MIL/uL (ref 4.22–5.81)
RDW: 14.8 % (ref 11.5–15.5)
WBC: 7.7 10*3/uL (ref 4.0–10.5)
nRBC: 0 % (ref 0.0–0.2)

## 2019-08-08 LAB — COMPREHENSIVE METABOLIC PANEL
ALT: 24 U/L (ref 0–44)
AST: 37 U/L (ref 15–41)
Albumin: 4 g/dL (ref 3.5–5.0)
Alkaline Phosphatase: 59 U/L (ref 38–126)
Anion gap: 16 — ABNORMAL HIGH (ref 5–15)
BUN: 33 mg/dL — ABNORMAL HIGH (ref 8–23)
CO2: 20 mmol/L — ABNORMAL LOW (ref 22–32)
Calcium: 7.5 mg/dL — ABNORMAL LOW (ref 8.9–10.3)
Chloride: 103 mmol/L (ref 98–111)
Creatinine, Ser: 2.66 mg/dL — ABNORMAL HIGH (ref 0.61–1.24)
GFR calc Af Amer: 25 mL/min — ABNORMAL LOW (ref >60)
GFR calc non Af Amer: 21 mL/min — ABNORMAL LOW (ref >60)
Glucose, Bld: 129 mg/dL — ABNORMAL HIGH (ref 70–99)
Potassium: 4.1 mmol/L (ref 3.5–5.1)
Sodium: 139 mmol/L (ref 135–145)
Total Bilirubin: 0.6 mg/dL (ref 0.3–1.2)
Total Protein: 7.7 g/dL (ref 6.5–8.1)

## 2019-08-08 LAB — TROPONIN I (HIGH SENSITIVITY): Troponin I (High Sensitivity): 11 ng/L (ref <18)

## 2019-08-08 MED ORDER — ONDANSETRON HCL 4 MG/2ML IJ SOLN
4.0000 mg | Freq: Once | INTRAMUSCULAR | Status: AC
  Administered 2019-08-08: 4 mg via INTRAVENOUS
  Filled 2019-08-08: qty 2

## 2019-08-08 MED ORDER — SODIUM CHLORIDE 0.9 % IV BOLUS
500.0000 mL | Freq: Once | INTRAVENOUS | Status: AC
  Administered 2019-08-08: 500 mL via INTRAVENOUS

## 2019-08-08 MED ORDER — ONDANSETRON 8 MG PO TBDP: ORAL_TABLET | ORAL | 0 refills | Status: DC

## 2019-08-08 MED FILL — ONDANSETRON ODT 8 MG TABLET: 8 | 4 days supply | Qty: 6 | Fill #0

## 2019-08-08 NOTE — ED Notes (Signed)
Pt provided bedside urinal, tv remote, phone and call bell. Pt resting comfortably.

## 2019-08-08 NOTE — ED Triage Notes (Signed)
Pt c/o cough that started last night. Pt also c/o emesis, 2 episodes. Denies fever, chest pain, sob, or headache. Pt reports living with a person that tested positive for covid.

## 2019-08-08 NOTE — ED Provider Notes (Signed)
[facility] EMERGENCY DEPARTMENT Provider Note   CSN: [id number] Arrival date & time: 08/08/19  6606     History   Chief Complaint Chief Complaint  Patient presents with  . Cough    HPI Juan Boyd is a 85 y.o. male.     Patient is an 85 year old male with past medical history of diabetes, hypertension, high cholesterol, and coronary artery disease with stents.  Patient presents today for evaluation of cough and vomiting.  This started yesterday evening.  Patient denies abdominal pain or diarrhea.  He denies fevers or chills.  His cough has been minimally productive.  Patient states that he has been exposed to COVID-19.  He says his son tested positive.  The history is provided by the patient.  Cough Cough characteristics:  Productive Severity:  Moderate Onset quality:  Sudden Duration:  24 hours Timing:  Constant Progression:  Worsening Chronicity:  New Smoker: no   Relieved by:  Nothing Worsened by:  Nothing Ineffective treatments:  None tried Associated symptoms: no chills and no fever     Past Medical History:  Diagnosis Date  . Diabetes mellitus without complication ([place])   . High cholesterol   . Hypertension     There are no active problems to display for this patient.   Past Surgical History:  Procedure Laterality Date  . CORONARY ANGIOPLASTY WITH STENT PLACEMENT    . EYE SURGERY          Home Medications    Prior to Admission medications   Medication Sig Start Date End Date Taking? Authorizing Provider  clopidogrel (PLAVIX) 75 MG tablet TAKE ONE TABLET BY MOUTH ONCE DAILY 12/09/15  Yes [provider]  amLODipine (NORVASC) 2.5 MG tablet Take 2.5 mg by mouth daily. 07/01/19   [provider]  aspirin EC 81 MG tablet Take by mouth.    [provider]  carvedilol (COREG) 6.25 MG tablet Take 6.25 mg by mouth 2 (two) times daily with a meal.    [provider]  colchicine 0.6 MG tablet Take 1 tablet (0.6 mg  total) by mouth daily. 04/11/15   Sherwood Gambler, MD  furosemide (LASIX) 20 MG tablet Take 1 tablet (20 mg total) by mouth daily. 06/17/15   Blanchie Dessert, MD  GARLIC PO Take 1 tablet by mouth daily.    [provider]  glipiZIDE (GLUCOTROL XL) 5 MG 24 hr tablet Take 5 mg by mouth daily.    [provider]  HYDROcodone-acetaminophen (NORCO) 5-325 MG per tablet Take 1 tablet by mouth every 6 (six) hours as needed for severe pain. 04/11/15   Sherwood Gambler, MD  lisinopril (PRINIVIL,ZESTRIL) 20 MG tablet Take 20 mg by mouth daily.    [provider]  Omega-3 Fatty Acids (FISH OIL PO) Take 1 capsule by mouth daily.    [provider]  potassium chloride SA (K-DUR,KLOR-CON) 20 MEQ tablet Take 20 mEq by mouth 2 (two) times daily.    [provider]  pravastatin (PRAVACHOL) 40 MG tablet Take 40 mg by mouth daily.    [provider]    Family History History reviewed. No pertinent family history.  Social History Social History   Tobacco Use  . Smoking status: Former Research scientist (life sciences)  . Smokeless tobacco: Never Used  Substance Use Topics  . Alcohol use: No  . Drug use: No     Allergies   Patient has no known allergies.   Review of Systems Review of Systems  Constitutional: Negative  for chills and fever.  Respiratory: Positive for cough.   All other systems reviewed and are negative.    Physical Exam Updated Vital Signs BP (!) 156/129 (BP Location: Right Arm)   Pulse 70   Temp 98.7 F (37.1 C) (Oral)   Resp 16   Ht 5\' 4"  (1.626 m)   Wt 76.2 kg   SpO2 97%   BMI 28.84 kg/m   Physical Exam Vitals signs and nursing note reviewed.  Constitutional:      General: He is not in acute distress.    Appearance: He is well-developed. He is not diaphoretic.  HENT:     Head: Normocephalic and atraumatic.  Neck:     Musculoskeletal: Normal range of motion and neck supple.  Cardiovascular:     Rate and Rhythm: Normal rate and regular  rhythm.     Heart sounds: No murmur. No friction rub.  Pulmonary:     Effort: Pulmonary effort is normal. No respiratory distress.     Breath sounds: Normal breath sounds. No wheezing or rales.  Abdominal:     General: Bowel sounds are normal. There is no distension.     Palpations: Abdomen is soft.     Tenderness: There is no abdominal tenderness.  Musculoskeletal: Normal range of motion.        General: No swelling or tenderness.     Right lower leg: No edema.     Left lower leg: No edema.  Skin:    General: Skin is warm and dry.  Neurological:     Mental Status: He is alert and oriented to person, place, and time.     Coordination: Coordination normal.      ED Treatments / Results  Labs (all labs ordered are listed, but only abnormal results are displayed) Labs Reviewed  COMPREHENSIVE METABOLIC PANEL  CBC WITH DIFFERENTIAL/PLATELET  TROPONIN I (HIGH SENSITIVITY)    EKG None  Radiology No results found.  Procedures Procedures (including critical care time)  Medications Ordered in ED Medications  sodium chloride 0.9 % bolus 500 mL (has no administration in time range)  ondansetron (ZOFRAN) injection 4 mg (has no administration in time range)     Initial Impression / Assessment and Plan / ED Course  I have reviewed the triage vital signs and the nursing notes.  Pertinent labs & imaging results that were available during my care of the patient were reviewed by me and considered in my medical decision making (see chart for details).  Patient is an 85 year old male presenting with complaints of vomiting and cough that started yesterday evening.  Patient is concerned about this because he states his son was diagnosed with COVID-19.  Patient denies to me he is experiencing difficulty breathing or fevers.  On exam, his vitals are stable and he is afebrile.  Oxygen saturations are in the upper 90s while on room air.  Patient has a normal heart and respiratory rate and  appears in no distress.  His work-up today reveals essentially unremarkable laboratory studies.  There is no evidence for a cardiac etiology.  Chest x-ray is clear.  A send out COVID test will be obtained and the patient appears appropriate for discharge.  He does have a mild increase in his creatinine above baseline, however last result on file is from 2017.  Patient was given IV fluids here as well as Zofran.  He will be advised to take increased fluid at home and ODT Zofran as prescribed today.  Final Clinical  Impressions(s) / ED Diagnoses   Final diagnoses:  None    ED Discharge Orders    None       Geoffery Lyonselo, Yamilex Borgwardt, MD 08/08/19 1028

## 2019-08-08 NOTE — ED Notes (Signed)
X-ray at bedside

## 2019-08-08 NOTE — Discharge Instructions (Addendum)
Zofran as prescribed as needed for nausea.  Increase your fluid intake by two 8 ounce glasses of water per day for the next several days.  You should follow-up later this week with your primary doctor for a repeat check of your kidney function.      Person Under Monitoring Name: Juan Boyd  Location: [place] [address]   Infection Prevention Recommendations for Individuals Confirmed to have, or Being Evaluated for, 2019 Novel Coronavirus (COVID-19) Infection Who Receive Care at Home  Individuals who are confirmed to have, or are being evaluated for, COVID-19 should follow the prevention steps below until a healthcare provider or local or state health department says they can return to normal activities.  Stay home except to get medical care You should restrict activities outside your home, except for getting medical care. Do not go to work, school, or public areas, and do not use public transportation or taxis.  Call ahead before visiting your doctor Before your medical appointment, call the healthcare provider and tell them that you have, or are being evaluated for, COVID-19 infection. This will help the healthcare providers office take steps to keep other people from getting infected. Ask your healthcare provider to call the local or state health department.  Monitor your symptoms Seek prompt medical attention if your illness is worsening (e.g., difficulty breathing). Before going to your medical appointment, call the healthcare provider and tell them that you have, or are being evaluated for, COVID-19 infection. Ask your healthcare provider to call the local or state health department.  Wear a facemask You should wear a facemask that covers your nose and mouth when you are in the same room with other people and when you visit a healthcare provider. People who live with or visit you should also wear a facemask while they are in the same room with  you.  Separate yourself from other people in your home As much as possible, you should stay in a different room from other people in your home. Also, you should use a separate bathroom, if available.  Avoid sharing household items You should not share dishes, drinking glasses, cups, eating utensils, towels, bedding, or other items with other people in your home. After using these items, you should wash them thoroughly with soap and water.  Cover your coughs and sneezes Cover your mouth and nose with a tissue when you cough or sneeze, or you can cough or sneeze into your sleeve. Throw used tissues in a lined trash can, and immediately wash your hands with soap and water for at least 20 seconds or use an alcohol-based hand rub.  Wash your [facility] your hands often and thoroughly with soap and water for at least 20 seconds. You can use an alcohol-based hand sanitizer if soap and water are not available and if your hands are not visibly dirty. Avoid touching your eyes, nose, and mouth with unwashed hands.   Prevention Steps for Caregivers and Household Members of Individuals Confirmed to have, or Being Evaluated for, COVID-19 Infection Being Cared for in the Home  If you live with, or provide care at home for, a person confirmed to have, or being evaluated for, COVID-19 infection please follow these guidelines to prevent infection:  Follow healthcare providers instructions Make sure that you understand and can help the patient follow any healthcare provider instructions for all care.  Provide for the patients basic needs You should help the patient with basic needs in the home and  provide support for getting groceries, prescriptions, and other personal needs.  Monitor the patients symptoms If they are getting sicker, call his or her medical provider and tell them that the patient has, or is being evaluated for, COVID-19 infection. This will help the healthcare providers office  take steps to keep other people from getting infected. Ask the healthcare provider to call the local or state health department.  Limit the number of people who have contact with the patient If possible, have only one caregiver for the patient. Other household members should stay in another home or place of residence. If this is not possible, they should stay in another room, or be separated from the patient as much as possible. Use a separate bathroom, if available. Restrict visitors who do not have an essential need to be in the home.  Keep older adults, very young children, and other sick people away from the patient Keep older adults, very young children, and those who have compromised immune systems or chronic health conditions away from the patient. This includes people with chronic heart, lung, or kidney conditions, diabetes, and cancer.  Ensure good ventilation Make sure that shared spaces in the home have good air flow, such as from an air conditioner or an opened window, weather permitting.  Wash your hands often Wash your hands often and thoroughly with soap and water for at least 20 seconds. You can use an alcohol based hand sanitizer if soap and water are not available and if your hands are not visibly dirty. Avoid touching your eyes, nose, and mouth with unwashed hands. Use disposable paper towels to dry your hands. If not available, use dedicated cloth towels and replace them when they become wet.  Wear a facemask and gloves Wear a disposable facemask at all times in the room and gloves when you touch or have contact with the patients blood, body fluids, and/or secretions or excretions, such as sweat, saliva, sputum, nasal mucus, vomit, urine, or feces.  Ensure the mask fits over your nose and mouth tightly, and do not touch it during use. Throw out disposable facemasks and gloves after using them. Do not reuse. Wash your hands immediately after removing your facemask and  gloves. If your personal clothing becomes contaminated, carefully remove clothing and launder. Wash your hands after handling contaminated clothing. Place all used disposable facemasks, gloves, and other waste in a lined container before disposing them with other household waste. Remove gloves and wash your hands immediately after handling these items.  Do not share dishes, glasses, or other household items with the patient Avoid sharing household items. You should not share dishes, drinking glasses, cups, eating utensils, towels, bedding, or other items with a patient who is confirmed to have, or being evaluated for, COVID-19 infection. After the person uses these items, you should wash them thoroughly with soap and water.  Wash laundry thoroughly Immediately remove and wash clothes or bedding that have blood, body fluids, and/or secretions or excretions, such as sweat, saliva, sputum, nasal mucus, vomit, urine, or feces, on them. Wear gloves when handling laundry from the patient. Read and follow directions on labels of laundry or clothing items and detergent. In general, wash and dry with the warmest temperatures recommended on the label.  Clean all areas the individual has used often Clean all touchable surfaces, such as counters, tabletops, doorknobs, bathroom fixtures, toilets, phones, keyboards, tablets, and bedside tables, every day. Also, clean any surfaces that may have blood, body fluids, and/or secretions or  excretions on them. Wear gloves when cleaning surfaces the patient has come in contact with. Use a diluted bleach solution (e.g., dilute bleach with 1 part bleach and 10 parts water) or a household disinfectant with a label that says EPA-registered for coronaviruses. To make a bleach solution at home, add 1 tablespoon of bleach to 1 quart (4 cups) of water. For a larger supply, add  cup of bleach to 1 gallon (16 cups) of water. Read labels of cleaning products and follow  recommendations provided on product labels. Labels contain instructions for safe and effective use of the cleaning product including precautions you should take when applying the product, such as wearing gloves or eye protection and making sure you have good ventilation during use of the product. Remove gloves and wash hands immediately after cleaning.  Monitor yourself for signs and symptoms of illness Caregivers and household members are considered close contacts, should monitor their health, and will be asked to limit movement outside of the home to the extent possible. Follow the monitoring steps for close contacts listed on the symptom monitoring form.   ? If you have additional questions, contact your local health department or call the epidemiologist on call at [phone] (available 24/7). ? This guidance is subject to change. For the most up-to-date guidance from [facility], please refer to their website: [email or web address]

## 2019-08-12 LAB — NOVEL CORONAVIRUS, NAA (HOSP ORDER, SEND-OUT TO REF LAB; TAT 18-24 HRS): SARS-CoV-2, NAA: DETECTED — AB

## 2020-01-15 ENCOUNTER — Emergency Department (HOSPITAL_BASED_OUTPATIENT_CLINIC_OR_DEPARTMENT_OTHER)
Admission: EM | Admit: 2020-01-15 | Discharge: 2020-01-15 | Disposition: A | Discharge status: Home / Self Care | Payer: Medicare PPO | Attending: Emergency Medicine | Admitting: Emergency Medicine

## 2020-01-15 ENCOUNTER — Encounter (HOSPITAL_BASED_OUTPATIENT_CLINIC_OR_DEPARTMENT_OTHER): Payer: Self-pay

## 2020-01-15 ENCOUNTER — Other Ambulatory Visit: Payer: Self-pay

## 2020-01-15 DIAGNOSIS — E119 Type 2 diabetes mellitus without complications: Secondary | ICD-10-CM | POA: Diagnosis not present

## 2020-01-15 DIAGNOSIS — I10 Essential (primary) hypertension: Secondary | ICD-10-CM | POA: Insufficient documentation

## 2020-01-15 DIAGNOSIS — L0291 Cutaneous abscess, unspecified: Secondary | ICD-10-CM

## 2020-01-15 DIAGNOSIS — Z87891 Personal history of nicotine dependence: Secondary | ICD-10-CM | POA: Insufficient documentation

## 2020-01-15 DIAGNOSIS — Z9861 Coronary angioplasty status: Secondary | ICD-10-CM | POA: Diagnosis not present

## 2020-01-15 DIAGNOSIS — E782 Mixed hyperlipidemia: Secondary | ICD-10-CM | POA: Insufficient documentation

## 2020-01-15 DIAGNOSIS — L989 Disorder of the skin and subcutaneous tissue, unspecified: Secondary | ICD-10-CM | POA: Diagnosis present

## 2020-01-15 DIAGNOSIS — L0231 Cutaneous abscess of buttock: Secondary | ICD-10-CM | POA: Insufficient documentation

## 2020-01-15 MED ORDER — PENTAFLUOROPROP-TETRAFLUOROETH EX AERO
INHALATION_SPRAY | CUTANEOUS | Status: AC
  Administered 2020-01-15: 30 via TOPICAL
  Filled 2020-01-15: qty 30

## 2020-01-15 MED ORDER — PENTAFLUOROPROP-TETRAFLUOROETH EX AERO: INHALATION_SPRAY | Freq: Once | CUTANEOUS | Status: AC

## 2020-01-15 NOTE — Discharge Instructions (Addendum)
You were evaluated in the Emergency Department and after careful evaluation, we did not find any emergent condition requiring admission or further testing in the hospital.  Your exam/testing today is overall reassuring.  We performed a drainage procedure here in the emergency department.  If the skin nodule is still present in 1 week, recommend follow-up with a dermatologist.  Please return to the Emergency Department if you experience any worsening of your condition.  We encourage you to follow up with a primary care provider.  Thank you for allowing [place] to be a part of your care.

## 2020-01-15 NOTE — ED Triage Notes (Signed)
Pt reports that he has sore and pain on his tailbone X 2 months.

## 2020-01-15 NOTE — ED Provider Notes (Signed)
[facility] Emergency Department Provider Note MRN:  [id number]  Arrival date & time: 01/15/20     Chief Complaint   Sore   History of Present Illness   Juan Boyd is a 86 y.o. year-old male with a history of diabetes, hypertension presenting to the ED with chief complaint of sore.  Patient explains that 2 months ago he was getting into the car and bumped his tailbone area on the edge of the seat.  Pain in this area since.  Has developed a sore or nodule to his tailbone area.  More painful over the past 2 or 3 days.  Denies fever, no nausea, no vomiting, no diarrhea, no chest pain, no shortness of breath, no other complaints.  Pain is mild, constant, worse with sitting.  Review of Systems  A complete 10 system review of systems was obtained and all systems are negative except as noted in the HPI and PMH.   Patient's Health History    Past Medical History:  Diagnosis Date  . Diabetes mellitus without complication (HCC)   . High cholesterol   . Hypertension     Past Surgical History:  Procedure Laterality Date  . CORONARY ANGIOPLASTY WITH STENT PLACEMENT    . EYE SURGERY      No family history on file.  Social History   Socioeconomic History  . Marital status: Married    Spouse name: Not on file  . Number of children: Not on file  . Years of education: Not on file  . Highest education level: Not on file  Occupational History  . Not on file  Tobacco Use  . Smoking status: Former Games developer  . Smokeless tobacco: Never Used  Substance and Sexual Activity  . Alcohol use: No  . Drug use: No  . Sexual activity: Not on file  Other Topics Concern  . Not on file  Social History Narrative  . Not on file   Social Determinants of Health   Financial Resource Strain:   . Difficulty of Paying Living Expenses: Not on file  Food Insecurity:   . Worried About Programme researcher, broadcasting/film/video in the Last Year: Not on file  . Ran Out of Food in the Last Year: Not on  file  Transportation Needs:   . Lack of Transportation (Medical): Not on file  . Lack of Transportation (Non-Medical): Not on file  Physical Activity:   . Days of Exercise per Week: Not on file  . Minutes of Exercise per Session: Not on file  Stress:   . Feeling of Stress : Not on file  Social Connections:   . Frequency of Communication with Friends and Family: Not on file  . Frequency of Social Gatherings with Friends and Family: Not on file  . Attends Religious Services: Not on file  . Active Member of Clubs or Organizations: Not on file  . Attends Banker Meetings: Not on file  . Marital Status: Not on file  Intimate Partner Violence:   . Fear of Current or Ex-Partner: Not on file  . Emotionally Abused: Not on file  . Physically Abused: Not on file  . Sexually Abused: Not on file     Physical Exam  Vital Signs and Nursing Notes reviewed Vitals:   01/15/20 1620 01/15/20 1723  BP: (!) 181/77 (!) 184/81  Pulse: 60 72  Resp: 18 18  Temp: 98.3 F (36.8 C)   SpO2: 96% 100%    CONSTITUTIONAL: Well-appearing, NAD NEURO:  Alert and oriented x 3, no focal deficits EYES:  eyes equal and reactive ENT/NECK:  no LAD, no JVD CARDIO: Regular rate, well-perfused, normal S1 and S2 PULM:  CTAB no wheezing or rhonchi GI/GU:  normal bowel sounds, non-distended, non-tender MSK/SPINE:  No gross deformities, no edema SKIN:  no rash, atraumatic; nodule to the right superior gluteal cleft, tender to palpation, expressive of purulent material PSYCH:  Appropriate speech and behavior  Diagnostic and Interventional Summary    EKG Interpretation  Date/Time:    Ventricular Rate:    PR Interval:    QRS Duration:   QT Interval:    QTC Calculation:   R Axis:     Text Interpretation:        Labs Reviewed - No data to display  No orders to display    Medications  pentafluoroprop-tetrafluoroeth (GEBAUERS) aerosol (30 application Topical Given 01/15/20 1834)     Procedures   /  Critical Care .Marland KitchenIncision and Drainage  Date/Time: 01/15/2020 7:06 PM Performed by: Sabas Sous, MD Authorized by: Sabas Sous, MD   Consent:    Consent obtained:  Verbal   Consent given by:  Patient   Risks discussed:  Bleeding, incomplete drainage, pain and damage to other organs Location:    Type:  Abscess   Size:  2cm   Location:  Anogenital   Anogenital location: Superior right gluteal cleft. Pre-procedure details:    Skin preparation:  Chloraprep Anesthesia (see MAR for exact dosages):    Anesthesia method:  Topical application   Topical anesthesia: Freeze spray. Procedure type:    Complexity:  Simple Procedure details:    Incision types:  Single straight   Incision depth:  Submucosal   Scalpel blade:  15   Drainage:  Bloody   Drainage amount:  Scant   Wound treatment:  Wound left open   Packing materials:  None Post-procedure details:    Patient tolerance of procedure:  Tolerated well, no immediate complications    ED Course and Medical Decision Making  I have reviewed the triage vital signs, the nursing notes, and pertinent available records from the EMR.  Pertinent labs & imaging results that were available during my care of the patient were reviewed by me and considered in my medical decision making (see below for details).     Skin nodule present for the past few months, seems to have occurred after trauma.  No significant trauma to warrant x-ray imaging.  Ambulating without assistance.  More painful over the past few days, expressive of purulent material on my exam.  Incision and drainage performed as described above.  No significant surrounding erythema or induration to suggest cellulitis or warrant need for antibiotic treatment.  Explained to patient that this nodule may need dermatology follow-up if it does not go away.    Elmer Sow. Pilar Plate, MD [facility] Emergency Medicine [facility] [email or web address]  Final Clinical  Impressions(s) / ED Diagnoses     ICD-10-CM   1. Abscess  L02.91     ED Discharge Orders    None       Discharge Instructions Discussed with and Provided to Patient:     Discharge Instructions     You were evaluated in the Emergency Department and after careful evaluation, we did not find any emergent condition requiring admission or further testing in the hospital.  Your exam/testing today is overall reassuring.  We performed a drainage procedure here in the emergency department.  If the skin  nodule is still present in 1 week, recommend follow-up with a dermatologist.  Please return to the Emergency Department if you experience any worsening of your condition.  We encourage you to follow up with a primary care provider.  Thank you for allowing [place] to be a part of your care.       Maudie Flakes, MD 01/15/20 Einar Crow

## 2020-05-08 ENCOUNTER — Emergency Department (HOSPITAL_BASED_OUTPATIENT_CLINIC_OR_DEPARTMENT_OTHER): Payer: Medicare PPO

## 2020-05-08 ENCOUNTER — Other Ambulatory Visit: Payer: Self-pay

## 2020-05-08 ENCOUNTER — Encounter (HOSPITAL_BASED_OUTPATIENT_CLINIC_OR_DEPARTMENT_OTHER): Payer: Self-pay | Admitting: Emergency Medicine

## 2020-05-08 ENCOUNTER — Emergency Department (HOSPITAL_BASED_OUTPATIENT_CLINIC_OR_DEPARTMENT_OTHER)
Admission: EM | Admit: 2020-05-08 | Discharge: 2020-05-08 | Disposition: A | Discharge status: Home / Self Care | Payer: Medicare PPO | Attending: Emergency Medicine | Admitting: Emergency Medicine

## 2020-05-08 DIAGNOSIS — I10 Essential (primary) hypertension: Secondary | ICD-10-CM | POA: Diagnosis not present

## 2020-05-08 DIAGNOSIS — E119 Type 2 diabetes mellitus without complications: Secondary | ICD-10-CM | POA: Diagnosis not present

## 2020-05-08 DIAGNOSIS — Z7982 Long term (current) use of aspirin: Secondary | ICD-10-CM | POA: Insufficient documentation

## 2020-05-08 DIAGNOSIS — R0789 Other chest pain: Secondary | ICD-10-CM | POA: Diagnosis not present

## 2020-05-08 DIAGNOSIS — Z87891 Personal history of nicotine dependence: Secondary | ICD-10-CM | POA: Insufficient documentation

## 2020-05-08 DIAGNOSIS — Z7902 Long term (current) use of antithrombotics/antiplatelets: Secondary | ICD-10-CM | POA: Insufficient documentation

## 2020-05-08 DIAGNOSIS — R52 Pain, unspecified: Secondary | ICD-10-CM

## 2020-05-08 DIAGNOSIS — M545 Low back pain, unspecified: Secondary | ICD-10-CM

## 2020-05-08 DIAGNOSIS — E782 Mixed hyperlipidemia: Secondary | ICD-10-CM | POA: Diagnosis not present

## 2020-05-08 DIAGNOSIS — Z79899 Other long term (current) drug therapy: Secondary | ICD-10-CM | POA: Diagnosis not present

## 2020-05-08 DIAGNOSIS — Z7984 Long term (current) use of oral hypoglycemic drugs: Secondary | ICD-10-CM | POA: Insufficient documentation

## 2020-05-08 MED ORDER — ACETAMINOPHEN 325 MG PO TABS
650.0000 mg | ORAL_TABLET | Freq: Once | ORAL | Status: AC
  Administered 2020-05-08: 650 mg via ORAL
  Filled 2020-05-08: qty 2
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
## 2020-05-08 NOTE — Discharge Instructions (Signed)
Take Tylenol as needed for pain for the next week Use a heating pad for sore muscles - use for 20 minutes several times a day Try gentle range of motion exercises Return for worsening symptoms
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
## 2020-10-31 ENCOUNTER — Other Ambulatory Visit: Payer: Self-pay

## 2020-10-31 ENCOUNTER — Emergency Department (HOSPITAL_BASED_OUTPATIENT_CLINIC_OR_DEPARTMENT_OTHER)
Admission: EM | Admit: 2020-10-31 | Discharge: 2020-10-31 | Disposition: A | Discharge status: Home / Self Care | Payer: Medicare PPO | Attending: Emergency Medicine | Admitting: Emergency Medicine

## 2020-10-31 ENCOUNTER — Encounter (HOSPITAL_BASED_OUTPATIENT_CLINIC_OR_DEPARTMENT_OTHER): Payer: Self-pay | Admitting: *Deleted

## 2020-10-31 DIAGNOSIS — Z87891 Personal history of nicotine dependence: Secondary | ICD-10-CM | POA: Diagnosis not present

## 2020-10-31 DIAGNOSIS — Z955 Presence of coronary angioplasty implant and graft: Secondary | ICD-10-CM | POA: Diagnosis not present

## 2020-10-31 DIAGNOSIS — Z7984 Long term (current) use of oral hypoglycemic drugs: Secondary | ICD-10-CM | POA: Diagnosis not present

## 2020-10-31 DIAGNOSIS — Z79899 Other long term (current) drug therapy: Secondary | ICD-10-CM | POA: Insufficient documentation

## 2020-10-31 DIAGNOSIS — E119 Type 2 diabetes mellitus without complications: Secondary | ICD-10-CM | POA: Diagnosis not present

## 2020-10-31 DIAGNOSIS — H9202 Otalgia, left ear: Secondary | ICD-10-CM | POA: Insufficient documentation

## 2020-10-31 DIAGNOSIS — I10 Essential (primary) hypertension: Secondary | ICD-10-CM | POA: Insufficient documentation

## 2020-10-31 DIAGNOSIS — Z7982 Long term (current) use of aspirin: Secondary | ICD-10-CM | POA: Diagnosis not present

## 2020-10-31 NOTE — ED Triage Notes (Signed)
C/o left ear pain x 3 days 

## 2020-10-31 NOTE — Discharge Instructions (Addendum)
Please follow up with primary care doctor.   Please take Tylenol (acetaminophen) to relieve your pain.  You may take tylenol, up to 1,000 mg (two extra strength pills).  Do not take more than 3,000 mg tylenol in a 24 hour period.  Please check all medication labels as many medications such as pain and cold medications may contain tylenol. Please do not drink alcohol while taking this medication.

## 2020-10-31 NOTE — ED Provider Notes (Signed)
[facility] EMERGENCY DEPARTMENT Provider Note   CSN: [id number] Arrival date & time: 10/31/20  1923     History Chief Complaint  Patient presents with  . Otalgia    Juan Boyd is a 86 y.o. male with past medical history of diabetes, high cholesterol, hypertension, who presents today for evaluation of pain in the left ear extending into the left side of the face.  He states that he has had multiple episodes over the past 3 hours.  He denies any trauma or rashes.  He put sweet oil in the ear which he states made it feel better however he is still having the pain intermittently.  He states that when it comes it lasts for about 5 minutes before disappearing.  He denies any fevers or weakness.  No changes in his vision.  No recent sickness or illness.  He denies any pain in his throat or in his eye.  He denies any recent trauma.  No aggravating or alleviating factors noted.  Patient's family member arrived and provided additional history.  They report that patient was seen for similar complaint about a month ago on the other side with his primary care doctor.  At that point he was suspected to have herpes zoster and have not developed a rash yet.  He was given prescriptions for prednisone and antivirals.  He states he did not fill or start taking these medications and never developed a rash.  He states that that side had resolved and now he is having symptoms on the left side. Family also reports that yesterday his right sided face near his chin and jaw was slightly swollen however that has resolved or at least improved significantly without specific treatment.  Patient was not complaining of pain in the area at that time.  HPI     Past Medical History:  Diagnosis Date  . Diabetes mellitus without complication (HCC)   . High cholesterol   . Hypertension     There are no problems to display for this patient.   Past Surgical History:  Procedure Laterality Date  . CORONARY  ANGIOPLASTY WITH STENT PLACEMENT    . EYE SURGERY         No family history on file.  Social History   Tobacco Use  . Smoking status: Former Games developermoker  . Smokeless tobacco: Never Used  Substance Use Topics  . Alcohol use: No  . Drug use: No    Home Medications Prior to Admission medications   Medication Sig Start Date End Date Taking? Authorizing Provider  amLODipine (NORVASC) 2.5 MG tablet Take 2.5 mg by mouth daily. 07/01/19   [provider]  aspirin EC 81 MG tablet Take by mouth.    [provider]  carvedilol (COREG) 6.25 MG tablet Take 6.25 mg by mouth 2 (two) times daily with a meal.    [provider]  clopidogrel (PLAVIX) 75 MG tablet TAKE ONE TABLET BY MOUTH ONCE DAILY 12/09/15   [provider]  colchicine 0.6 MG tablet Take 1 tablet (0.6 mg total) by mouth daily. 04/11/15   Pricilla LovelessGoldston, Scott, MD  EUTHYROX 50 MCG tablet Take 50 mcg by mouth at bedtime. 09/03/20   [provider]  furosemide (LASIX) 20 MG tablet Take 1 tablet (20 mg total) by mouth daily. 06/17/15   Gwyneth SproutPlunkett, Whitney, MD  GARLIC PO Take 1 tablet by mouth daily.    [provider]  glipiZIDE (GLUCOTROL XL) 5 MG 24 hr tablet Take 5  mg by mouth daily.    [provider]  HYDROcodone-acetaminophen (NORCO) 5-325 MG per tablet Take 1 tablet by mouth every 6 (six) hours as needed for severe pain. 04/11/15   Pricilla Loveless, MD  JANUVIA 50 MG tablet Take 50 mg by mouth daily. 09/04/20   [provider]  lisinopril (PRINIVIL,ZESTRIL) 20 MG tablet Take 20 mg by mouth daily.    [provider]  Omega-3 Fatty Acids (FISH OIL PO) Take 1 capsule by mouth daily.    [provider]  ondansetron (ZOFRAN ODT) 8 MG disintegrating tablet 8mg  ODT q4 hours prn nausea 08/08/19   10/08/19, MD  potassium chloride SA (K-DUR,KLOR-CON) 20 MEQ tablet Take 20 mEq by mouth 2 (two) times daily.    [provider]  pravastatin (PRAVACHOL) 40 MG  tablet Take 40 mg by mouth daily.    [provider]  rosuvastatin (CRESTOR) 40 MG tablet Take 40 mg by mouth daily. 08/02/20   [provider]    Allergies    Patient has no known allergies.  Review of Systems   Review of Systems  Constitutional: Negative for chills and fever.  HENT: Positive for ear pain. Negative for ear discharge, facial swelling, hearing loss, postnasal drip, sinus pressure, sinus pain, sore throat, tinnitus, trouble swallowing and voice change.   Eyes: Negative for photophobia, pain and visual disturbance.  Respiratory: Negative for chest tightness and shortness of breath.   Cardiovascular: Negative for chest pain.  Gastrointestinal: Negative for abdominal pain.  Neurological: Negative for weakness, light-headedness and headaches.  All other systems reviewed and are negative.   Physical Exam Updated Vital Signs BP (!) 152/72 (BP Location: Right Arm)   Pulse 71   Temp 98.2 F (36.8 C) (Oral)   Resp 18   Ht 5\' 4"  (1.626 m)   Wt 72.6 kg   SpO2 99%   BMI 27.46 kg/m   Physical Exam Vitals and nursing note reviewed.  Constitutional:      General: He is not in acute distress.    Appearance: He is well-developed. He is not diaphoretic.  HENT:     Head: Normocephalic and atraumatic.     Comments: No tenderness over bilateral temporal arteries.  No localized tenderness over the left mastoid, no significant contusion, abrasion or swelling over the left or right sided face or head.  No abnormal induration or fluctuance.  No wounds.  There is diffuse tenderness over the scalp on the left side of the head over the parietal area.    Right Ear: Tympanic membrane, ear canal and external ear normal.     Left Ear: Tympanic membrane, ear canal and external ear normal.     Ears:     Comments: Hearing is grossly baseline per patient.    Nose: Nose normal.     Mouth/Throat:     Mouth: Mucous membranes are moist.     Comments: Patient is a dentulous, no  obvious intraoral edema.  Throat is clear and moist, uvula elevates symmetrically.  No edema under the tongue or submandibular swelling. Eyes:     General: No scleral icterus.       Right eye: No discharge.        Left eye: No discharge.     Conjunctiva/sclera: Conjunctivae normal.  Cardiovascular:     Rate and Rhythm: Normal rate and regular rhythm.     Heart sounds: Normal heart sounds.  Pulmonary:     Effort: Pulmonary effort is normal. No  respiratory distress.     Breath sounds: No stridor.  Abdominal:     General: There is no distension.  Musculoskeletal:        General: No deformity.     Cervical back: Normal range of motion and neck supple. No rigidity or tenderness.  Lymphadenopathy:     Cervical: No cervical adenopathy.  Skin:    General: Skin is warm and dry.  Neurological:     Mental Status: He is alert.     Motor: No abnormal muscle tone.     Comments: Patient is awake and alert, answers questions appropriately.  Spontaneous movement of bilateral arms and legs.  No facial droop.  Eyebrows elevate symmetrically.  Smile is symmetric.  He is able to puff his cheeks like a fish without difficulty.  Tongue protrusion is midline and symmetric  Psychiatric:        Behavior: Behavior normal.     ED Results / Procedures / Treatments   Labs (all labs ordered are listed, but only abnormal results are displayed) Labs Reviewed - No data to display  EKG None  Radiology No results found.  Procedures Procedures (including critical care time)  Medications Ordered in ED Medications - No data to display  ED Course  I have reviewed the triage vital signs and the nursing notes.  Pertinent labs & imaging results that were available during my care of the patient were reviewed by me and considered in my medical decision making (see chart for details).    MDM Rules/Calculators/A&P                         Patient is a 86 year old man who presents today for evaluation of 3  hours of pain in his left ear that comes and goes.  His family member provided the additional history that he had been seen and treated for pain in a similar distribution on the right side about a month ago and at that point it was suspected to be shingles prior to the onset of the rash.  He did not take his antivirals or prednisone and did not develop a rash.  Family reports he did have some swelling yesterday however I do not appreciate any significant swelling. No evidence of temporal arteritis, no vision changes.  His facial movements are symmetric without weakness, I do not see evidence of a intraoral cause for his symptoms.  No evidence of mastoiditis, otitis media, otitis externa, or serous effusion.  No evidence of bacterial infection of the skin.  He does not have pain with opening or closing his mouth, doubt TMJ.  Given that his symptoms were on the other side a month ago, he did not develop a rash, lower suspicion for zoster.  Given that his symptoms were originally on the other side a month ago and resolved without significant intervention or treatment overall low suspicion for severe or life-threatening cause of his symptoms.  No vision changes or eye pain, doubt glaucoma. Recommended Tylenol and ice as needed for pain.  I suspect he may have a degree of trigeminal neuralgia.  Patient and family are educated on PCP follow-up along with appropriate return precautions.  Return precautions were discussed with patient and family who states their understanding.  At the time of discharge patient denied any unaddressed complaints or concerns.  Patient is agreeable for discharge home.  Note: Portions of this report may have been transcribed using voice recognition software. Every effort was made  to ensure accuracy; however, inadvertent computerized transcription errors may be present  Final Clinical Impression(s) / ED Diagnoses Final diagnoses:  Left ear pain    Rx / DC Orders ED Discharge Orders     None       Cristina Gong, PA-C 11/01/20 1459    Charlynne Pander, MD 11/05/20 [phone]

## 2020-12-11 ENCOUNTER — Other Ambulatory Visit: Payer: Self-pay

## 2020-12-11 ENCOUNTER — Emergency Department (HOSPITAL_BASED_OUTPATIENT_CLINIC_OR_DEPARTMENT_OTHER)
Admission: EM | Admit: 2020-12-11 | Discharge: 2020-12-11 | Disposition: A | Discharge status: Home / Self Care | Payer: Medicare PPO | Attending: Emergency Medicine | Admitting: Emergency Medicine

## 2020-12-11 ENCOUNTER — Encounter (HOSPITAL_BASED_OUTPATIENT_CLINIC_OR_DEPARTMENT_OTHER): Payer: Self-pay

## 2020-12-11 DIAGNOSIS — Z7984 Long term (current) use of oral hypoglycemic drugs: Secondary | ICD-10-CM | POA: Insufficient documentation

## 2020-12-11 DIAGNOSIS — I10 Essential (primary) hypertension: Secondary | ICD-10-CM | POA: Insufficient documentation

## 2020-12-11 DIAGNOSIS — Z951 Presence of aortocoronary bypass graft: Secondary | ICD-10-CM | POA: Diagnosis not present

## 2020-12-11 DIAGNOSIS — Z87891 Personal history of nicotine dependence: Secondary | ICD-10-CM | POA: Diagnosis not present

## 2020-12-11 DIAGNOSIS — Z79899 Other long term (current) drug therapy: Secondary | ICD-10-CM | POA: Insufficient documentation

## 2020-12-11 DIAGNOSIS — Z7982 Long term (current) use of aspirin: Secondary | ICD-10-CM | POA: Insufficient documentation

## 2020-12-11 DIAGNOSIS — E119 Type 2 diabetes mellitus without complications: Secondary | ICD-10-CM | POA: Insufficient documentation

## 2020-12-11 DIAGNOSIS — R220 Localized swelling, mass and lump, head: Secondary | ICD-10-CM | POA: Insufficient documentation

## 2020-12-11 MED ORDER — DEXAMETHASONE 6 MG PO TABS
6.0000 mg | ORAL_TABLET | Freq: Once | ORAL | Status: AC
  Administered 2020-12-11: 6 mg via ORAL
  Filled 2020-12-11: qty 1

## 2020-12-11 MED ORDER — DIPHENHYDRAMINE HCL 25 MG PO CAPS
25.0000 mg | ORAL_CAPSULE | Freq: Once | ORAL | Status: AC
  Administered 2020-12-11: 25 mg via ORAL
  Filled 2020-12-11: qty 1

## 2020-12-11 NOTE — ED Notes (Signed)
Review D/C papers with pt, pt states understanding, pt denies questions at this time. 

## 2020-12-11 NOTE — ED Triage Notes (Signed)
Pt compliant of "my lip swelling", pt complaint of R jaw swelling a few days ago and it went down. Pt denies difficulty breathing.

## 2020-12-11 NOTE — ED Provider Notes (Signed)
[facility] EMERGENCY DEPARTMENT Provider Note   CSN: [id number] Arrival date & time: 12/11/20  7106     History Chief Complaint  Patient presents with  . Oral Swelling    Juan Boyd is a 86 y.o. male.  The history is provided by the patient.  Illness Location:  Swelling to lower lip Severity:  Mild Onset quality:  Gradual Duration:  5 hours Timing:  Constant Progression:  Unchanged Chronicity:  New Context:  Woke up 5 hours ago with lower lip swelling, takes lisinopril, never had this happen before. No allergies Relieved by:  Nothing Worsened by:  Nothing Associated symptoms: no abdominal pain, no chest pain, no congestion, no cough, no ear pain, no fever, no rash, no shortness of breath, no sore throat, no vomiting and no wheezing        Past Medical History:  Diagnosis Date  . Diabetes mellitus without complication (HCC)   . High cholesterol   . Hypertension     There are no problems to display for this patient.   Past Surgical History:  Procedure Laterality Date  . CORONARY ANGIOPLASTY WITH STENT PLACEMENT    . EYE SURGERY         History reviewed. No pertinent family history.  Social History   Tobacco Use  . Smoking status: Former Games developer  . Smokeless tobacco: Never Used  Substance Use Topics  . Alcohol use: No  . Drug use: No    Home Medications Prior to Admission medications   Medication Sig Start Date End Date Taking? Authorizing Provider  amLODipine (NORVASC) 2.5 MG tablet Take 2.5 mg by mouth daily. 07/01/19   [provider]  aspirin EC 81 MG tablet Take by mouth.    [provider]  carvedilol (COREG) 6.25 MG tablet Take 6.25 mg by mouth 2 (two) times daily with a meal.    [provider]  clopidogrel (PLAVIX) 75 MG tablet TAKE ONE TABLET BY MOUTH ONCE DAILY 12/09/15   [provider]  colchicine 0.6 MG tablet Take 1 tablet (0.6 mg total) by mouth daily. 04/11/15   Pricilla Loveless, MD   EUTHYROX 50 MCG tablet Take 50 mcg by mouth at bedtime. 09/03/20   [provider]  furosemide (LASIX) 20 MG tablet Take 1 tablet (20 mg total) by mouth daily. 06/17/15   Gwyneth Sprout, MD  GARLIC PO Take 1 tablet by mouth daily.    [provider]  glipiZIDE (GLUCOTROL XL) 5 MG 24 hr tablet Take 5 mg by mouth daily.    [provider]  HYDROcodone-acetaminophen (NORCO) 5-325 MG per tablet Take 1 tablet by mouth every 6 (six) hours as needed for severe pain. 04/11/15   Pricilla Loveless, MD  JANUVIA 50 MG tablet Take 50 mg by mouth daily. 09/04/20   [provider]  Omega-3 Fatty Acids (FISH OIL PO) Take 1 capsule by mouth daily.    [provider]  ondansetron (ZOFRAN ODT) 8 MG disintegrating tablet 8mg  ODT q4 hours prn nausea 08/08/19   10/08/19, MD  potassium chloride SA (K-DUR,KLOR-CON) 20 MEQ tablet Take 20 mEq by mouth 2 (two) times daily.    [provider]  pravastatin (PRAVACHOL) 40 MG tablet Take 40 mg by mouth daily.    [provider]  rosuvastatin (CRESTOR) 40 MG tablet Take 40 mg by mouth daily. 08/02/20   [provider]  lisinopril (PRINIVIL,ZESTRIL) 20 MG tablet Take 20 mg by mouth daily.  12/11/20  [provider]    Allergies    Patient has no known allergies.  Review of Systems   Review of Systems  Constitutional: Negative for chills and fever.  HENT: Positive for facial swelling (lower lip swelling). Negative for congestion, dental problem, drooling, ear pain, sore throat, trouble swallowing and voice change.   Eyes: Negative for pain and visual disturbance.  Respiratory: Negative for cough, shortness of breath and wheezing.   Cardiovascular: Negative for chest pain and palpitations.  Gastrointestinal: Negative for abdominal pain and vomiting.  Genitourinary: Negative for dysuria and hematuria.  Musculoskeletal: Negative for arthralgias and back pain.  Skin: Negative for color change and  rash.  Neurological: Negative for seizures and syncope.  All other systems reviewed and are negative.   Physical Exam Updated Vital Signs BP (!) 165/88   Pulse (!) 58   Temp 98.5 F (36.9 C)   Resp 16   Ht 5\' 4"  (1.626 m)   Wt 74.8 kg   SpO2 100%   BMI 28.32 kg/m   Physical Exam Vitals and nursing note reviewed.  Constitutional:      Appearance: He is well-developed and well-nourished.  HENT:     Head: Normocephalic and atraumatic.     Comments: Swelling to the lower lip but there is no obvious trismus, no drooling, no submandibular swelling overall no major signs to suggest anaphylaxis    Nose: Nose normal.     Mouth/Throat:     Mouth: Mucous membranes are moist.  Eyes:     Conjunctiva/sclera: Conjunctivae normal.     Pupils: Pupils are equal, round, and reactive to light.  Cardiovascular:     Rate and Rhythm: Normal rate and regular rhythm.     Heart sounds: No murmur heard.   Pulmonary:     Effort: Pulmonary effort is normal. No respiratory distress.     Breath sounds: Normal breath sounds.  Abdominal:     Palpations: Abdomen is soft.     Tenderness: There is no abdominal tenderness.  Musculoskeletal:        General: No edema.     Cervical back: Neck supple.  Skin:    General: Skin is warm and dry.  Neurological:     Mental Status: He is alert.  Psychiatric:        Mood and Affect: Mood and affect normal.     ED Results / Procedures / Treatments   Labs (all labs ordered are listed, but only abnormal results are displayed) Labs Reviewed - No data to display  EKG None  Radiology No results found.  Procedures Procedures (including critical care time)  Medications Ordered in ED Medications  diphenhydrAMINE (BENADRYL) capsule 25 mg (25 mg Oral Given 12/11/20 0902)  dexamethasone (DECADRON) tablet 6 mg (6 mg Oral Given 12/11/20 12/13/20)    ED Course  I have reviewed the triage vital signs and the nursing notes.  Pertinent labs & imaging results  that were available during my care of the patient were reviewed by me and considered in my medical decision making (see chart for details).    MDM Rules/Calculators/A&P                          Xander Jutras is an 86 year old male with history of hypertension who presents to the ED with lip swelling.  Normal vitals except for mild hypertension.  No fever.  Woke up about 5 hours ago with lower lip swelling.  Is on lisinopril.  No history of angioedema.  No history of major allergies.  Does not appear consistent with anaphylaxis.  Has some angioedema to the lower lip likely secondary to ACE inhibitor.  Overall no signs of distress.  Swelling is localized to the lower lip.  Will observe but anticipate discharge home.  Educated to stop ACE inhibitor and have close follow-up with primary care doctor.  Benadryl and Decadron given in case allergic process.  11:20 AM patient reevaluated after 2 hours and there has been no progressive swelling.  We will continue to monitor.  11:20 AM it has been about 8 hours since he noticed lip swelling.  Overall swelling has stayed the same.  Localized to the lower lip but no swelling of the tongue, submandibular space, soft palate, hard palate.  No concern for Ludwigs angina.  Overall educated about cessation of lisinopril and need for outpatient follow-up to discuss further blood pressure regimen.  Discharged in good condition.  Understand return precautions.  This chart was dictated using voice recognition software.  Despite best efforts to proofread,  errors can occur which can change the documentation meaning.   Final Clinical Impression(s) / ED Diagnoses Final diagnoses:  Lip swelling    Rx / DC Orders ED Discharge Orders    None       Virgina Norfolk, DO 12/11/20 1120

## 2020-12-11 NOTE — ED Notes (Signed)
Pt able to drink water and take PO medications without difficulty.

## 2020-12-11 NOTE — ED Notes (Signed)
Pt ambulated from room to restroom without difficulty.

## 2020-12-11 NOTE — Discharge Instructions (Addendum)
Please stop using your lisinopril as this may have caused your lip swelling.  Follow-up with your primary care doctor to discuss further medication.  Please return to the emergency department symptoms worsen as discussed.  You may use Benadryl as needed.

## 2021-10-07 ENCOUNTER — Other Ambulatory Visit: Payer: Self-pay

## 2021-10-07 ENCOUNTER — Emergency Department (HOSPITAL_BASED_OUTPATIENT_CLINIC_OR_DEPARTMENT_OTHER): Payer: Medicare PPO

## 2021-10-07 ENCOUNTER — Emergency Department (HOSPITAL_BASED_OUTPATIENT_CLINIC_OR_DEPARTMENT_OTHER)
Admission: EM | Admit: 2021-10-07 | Discharge: 2021-10-07 | Disposition: A | Discharge status: Home / Self Care | Payer: Medicare PPO | Attending: Emergency Medicine | Admitting: Emergency Medicine

## 2021-10-07 ENCOUNTER — Encounter (HOSPITAL_BASED_OUTPATIENT_CLINIC_OR_DEPARTMENT_OTHER): Payer: Self-pay

## 2021-10-07 DIAGNOSIS — Z7984 Long term (current) use of oral hypoglycemic drugs: Secondary | ICD-10-CM | POA: Insufficient documentation

## 2021-10-07 DIAGNOSIS — I10 Essential (primary) hypertension: Secondary | ICD-10-CM | POA: Diagnosis not present

## 2021-10-07 DIAGNOSIS — Z7982 Long term (current) use of aspirin: Secondary | ICD-10-CM | POA: Insufficient documentation

## 2021-10-07 DIAGNOSIS — Z79899 Other long term (current) drug therapy: Secondary | ICD-10-CM | POA: Diagnosis not present

## 2021-10-07 DIAGNOSIS — M25562 Pain in left knee: Secondary | ICD-10-CM | POA: Diagnosis not present

## 2021-10-07 DIAGNOSIS — E119 Type 2 diabetes mellitus without complications: Secondary | ICD-10-CM | POA: Insufficient documentation

## 2021-10-07 DIAGNOSIS — Z7902 Long term (current) use of antithrombotics/antiplatelets: Secondary | ICD-10-CM | POA: Diagnosis not present

## 2021-10-07 DIAGNOSIS — M7989 Other specified soft tissue disorders: Secondary | ICD-10-CM | POA: Diagnosis not present

## 2021-10-07 DIAGNOSIS — M10362 Gout due to renal impairment, left knee: Secondary | ICD-10-CM

## 2021-10-07 LAB — BASIC METABOLIC PANEL
Anion gap: 7 (ref 5–15)
BUN: 26 mg/dL — ABNORMAL HIGH (ref 8–23)
CO2: 24 mmol/L (ref 22–32)
Calcium: 7.2 mg/dL — ABNORMAL LOW (ref 8.9–10.3)
Chloride: 105 mmol/L (ref 98–111)
Creatinine, Ser: 2.26 mg/dL — ABNORMAL HIGH (ref 0.61–1.24)
GFR, Estimated: 28 mL/min — ABNORMAL LOW (ref >60)
Glucose, Bld: 151 mg/dL — ABNORMAL HIGH (ref 70–99)
Potassium: 4 mmol/L (ref 3.5–5.1)
Sodium: 136 mmol/L (ref 135–145)

## 2021-10-07 LAB — SYNOVIAL CELL COUNT + DIFF, W/ CRYSTALS
Eosinophils-Synovial: 0 % (ref 0–1)
Lymphocytes-Synovial Fld: 3 % (ref 0–20)
Monocyte-Macrophage-Synovial Fluid: 14 % — ABNORMAL LOW (ref 50–90)
Neutrophil, Synovial: 83 % — ABNORMAL HIGH (ref 0–25)
WBC, Synovial: 11520 /mm3 — ABNORMAL HIGH (ref 0–200)

## 2021-10-07 LAB — CBC WITH DIFFERENTIAL/PLATELET
Abs Immature Granulocytes: 0.05 10*3/uL (ref 0.00–0.07)
Basophils Absolute: 0.1 10*3/uL (ref 0.0–0.1)
Basophils Relative: 0 %
Eosinophils Absolute: 0.2 10*3/uL (ref 0.0–0.5)
Eosinophils Relative: 2 %
HCT: 35.3 % — ABNORMAL LOW (ref 39.0–52.0)
Hemoglobin: 11.5 g/dL — ABNORMAL LOW (ref 13.0–17.0)
Immature Granulocytes: 0 %
Lymphocytes Relative: 15 %
Lymphs Abs: 1.8 10*3/uL (ref 0.7–4.0)
MCH: 27.2 pg (ref 26.0–34.0)
MCHC: 32.6 g/dL (ref 30.0–36.0)
MCV: 83.5 fL (ref 80.0–100.0)
Monocytes Absolute: 1.2 10*3/uL — ABNORMAL HIGH (ref 0.1–1.0)
Monocytes Relative: 10 %
Neutro Abs: 8.5 10*3/uL — ABNORMAL HIGH (ref 1.7–7.7)
Neutrophils Relative %: 73 %
Platelets: 175 10*3/uL (ref 150–400)
RBC: 4.23 MIL/uL (ref 4.22–5.81)
RDW: 15.8 % — ABNORMAL HIGH (ref 11.5–15.5)
WBC: 11.8 10*3/uL — ABNORMAL HIGH (ref 4.0–10.5)
nRBC: 0 % (ref 0.0–0.2)

## 2021-10-07 LAB — SEDIMENTATION RATE: Sed Rate: 80 mm/hr — ABNORMAL HIGH (ref 0–16)

## 2021-10-07 MED ORDER — OXYCODONE HCL 5 MG PO TABS: 5.0000 mg | ORAL_TABLET | Freq: Four times a day (QID) | ORAL | 0 refills | Status: AC | PRN

## 2021-10-07 MED ORDER — ACETAMINOPHEN 500 MG PO TABS
1000.0000 mg | ORAL_TABLET | Freq: Once | ORAL | Status: AC
  Administered 2021-10-07: 1000 mg via ORAL
  Filled 2021-10-07: qty 2

## 2021-10-07 MED ORDER — LIDOCAINE HCL (PF) 1 % IJ SOLN
30.0000 mL | Freq: Once | INTRAMUSCULAR | Status: AC
  Administered 2021-10-07: 10 mL
  Filled 2021-10-07: qty 30

## 2021-10-07 MED ORDER — COLCHICINE 0.6 MG PO TABS
0.6000 mg | ORAL_TABLET | Freq: Once | ORAL | Status: AC
  Administered 2021-10-07: 0.6 mg via ORAL
  Filled 2021-10-07: qty 1

## 2021-10-07 MED ORDER — LIDOCAINE-EPINEPHRINE 2 %-1:100000 IJ SOLN: 20.0000 mL | Freq: Once | INTRAMUSCULAR | Status: DC

## 2021-10-07 MED ORDER — OXYCODONE HCL 5 MG PO TABS
5.0000 mg | ORAL_TABLET | Freq: Once | ORAL | Status: AC
  Administered 2021-10-07: 5 mg via ORAL
  Filled 2021-10-07: qty 1

## 2021-10-07 MED ORDER — COLCHICINE 0.6 MG PO TABS
1.2000 mg | ORAL_TABLET | Freq: Once | ORAL | Status: AC
  Administered 2021-10-07: 1.2 mg via ORAL
  Filled 2021-10-07: qty 2

## 2021-10-07 NOTE — ED Triage Notes (Signed)
Pt c/o pain to left knee. Swelling from knee down to lower leg. Denies injury, denies hx of DVT

## 2021-10-07 NOTE — ED Provider Notes (Signed)
[facility] EMERGENCY DEPARTMENT Provider Note   CSN: [id number] Arrival date & time: 10/07/21  1054     History Chief Complaint  Patient presents with   Leg Swelling    Juan Boyd is a 87 y.o. male.  87 yo M with a chief complaints of left knee pain.  This been going on since yesterday has gotten significantly worse in the leg is swollen from the knee down.  Has a history of gout but never in that knee before.  Denies trauma denies break in the skin denies fever.  Feels like the pain is worse in his knee.  Has trouble bending it bearing weight.  The history is provided by the patient.  Illness Severity:  Moderate Onset quality:  Gradual Duration:  2 days Timing:  Constant Progression:  Worsening Chronicity:  New Associated symptoms: no abdominal pain, no chest pain, no congestion, no diarrhea, no fever, no headaches, no myalgias, no rash, no shortness of breath and no vomiting       Past Medical History:  Diagnosis Date   Diabetes mellitus without complication (HCC)    High cholesterol    Hypertension     There are no problems to display for this patient.   Past Surgical History:  Procedure Laterality Date   CORONARY ANGIOPLASTY WITH STENT PLACEMENT     EYE SURGERY         History reviewed. No pertinent family history.  Social History   Tobacco Use   Smoking status: Former   Smokeless tobacco: Never  Substance Use Topics   Alcohol use: No   Drug use: No    Home Medications Prior to Admission medications   Medication Sig Start Date End Date Taking? Authorizing Provider  amLODipine (NORVASC) 2.5 MG tablet Take 2.5 mg by mouth daily. 07/01/19   [provider]  aspirin EC 81 MG tablet Take by mouth.    [provider]  carvedilol (COREG) 6.25 MG tablet Take 6.25 mg by mouth 2 (two) times daily with a meal.    [provider]  clopidogrel (PLAVIX) 75 MG tablet TAKE ONE TABLET BY MOUTH ONCE DAILY 12/09/15   [provider]  colchicine 0.6 MG tablet Take 1 tablet (0.6 mg total) by mouth daily. 04/11/15   Pricilla Loveless, MD  EUTHYROX 50 MCG tablet Take 50 mcg by mouth at bedtime. 09/03/20   [provider]  furosemide (LASIX) 20 MG tablet Take 1 tablet (20 mg total) by mouth daily. 06/17/15   Gwyneth Sprout, MD  GARLIC PO Take 1 tablet by mouth daily.    [provider]  glipiZIDE (GLUCOTROL XL) 5 MG 24 hr tablet Take 5 mg by mouth daily.    [provider]  HYDROcodone-acetaminophen (NORCO) 5-325 MG per tablet Take 1 tablet by mouth every 6 (six) hours as needed for severe pain. 04/11/15   Pricilla Loveless, MD  JANUVIA 50 MG tablet Take 50 mg by mouth daily. 09/04/20   [provider]  Omega-3 Fatty Acids (FISH OIL PO) Take 1 capsule by mouth daily.    [provider]  ondansetron (ZOFRAN ODT) 8 MG disintegrating tablet 8mg  ODT q4 hours prn nausea 08/08/19   10/08/19, MD  potassium chloride SA (K-DUR,KLOR-CON) 20 MEQ tablet Take 20 mEq by mouth 2 (two) times daily.    [provider]  pravastatin (PRAVACHOL) 40 MG tablet Take 40 mg by mouth daily.    [provider]  rosuvastatin (CRESTOR) 40 MG  tablet Take 40 mg by mouth daily. 08/02/20   [provider]  lisinopril (PRINIVIL,ZESTRIL) 20 MG tablet Take 20 mg by mouth daily.  12/11/20  [provider]    Allergies    Esomeprazole magnesium and Oxycodone  Review of Systems   Review of Systems  Constitutional:  Negative for chills and fever.  HENT:  Negative for congestion and facial swelling.   Eyes:  Negative for discharge and visual disturbance.  Respiratory:  Negative for shortness of breath.   Cardiovascular:  Positive for leg swelling. Negative for chest pain and palpitations.  Gastrointestinal:  Negative for abdominal pain, diarrhea and vomiting.  Musculoskeletal:  Positive for arthralgias. Negative for myalgias.  Skin:  Negative for color change and rash.   Neurological:  Negative for tremors, syncope and headaches.  Psychiatric/Behavioral:  Negative for confusion and dysphoric mood.    Physical Exam Updated Vital Signs BP 134/66   Pulse (!) 57   Temp 98.1 F (36.7 C) (Oral)   Resp 18   Ht 5\' 4"  (1.626 m)   Wt 72.6 kg   SpO2 99%   BMI 27.46 kg/m   Physical Exam Vitals and nursing note reviewed.  Constitutional:      Appearance: He is well-developed.  HENT:     Head: Normocephalic and atraumatic.  Eyes:     Pupils: Pupils are equal, round, and reactive to light.  Neck:     Vascular: No JVD.  Cardiovascular:     Rate and Rhythm: Normal rate and regular rhythm.     Heart sounds: No murmur heard.   No friction rub. No gallop.  Pulmonary:     Effort: No respiratory distress.     Breath sounds: No wheezing.  Abdominal:     General: There is no distension.     Tenderness: There is no abdominal tenderness. There is no guarding or rebound.  Musculoskeletal:        General: Swelling and tenderness present. Normal range of motion.     Cervical back: Normal range of motion and neck supple.     Comments: Pain and swelling to the left knee with extension down the leg.  Much more swollen than the other leg.  Pain with even slight range of motion of the left knee.  Effusion noted.  Small scab to the anterior aspect of the tibia.  No obvious erythema or warmth.  Skin:    Coloration: Skin is not pale.     Findings: No rash.  Neurological:     Mental Status: He is alert and oriented to person, place, and time.  Psychiatric:        Behavior: Behavior normal.    ED Results / Procedures / Treatments   Labs (all labs ordered are listed, but only abnormal results are displayed) Labs Reviewed  CBC WITH DIFFERENTIAL/PLATELET - Abnormal; Notable for the following components:      Result Value   WBC 11.8 (*)    Hemoglobin 11.5 (*)    HCT 35.3 (*)    RDW 15.8 (*)    Neutro Abs 8.5 (*)    Monocytes Absolute 1.2 (*)    All other  components within normal limits  BASIC METABOLIC PANEL - Abnormal; Notable for the following components:   Glucose, Bld 151 (*)    BUN 26 (*)    Creatinine, Ser 2.26 (*)    Calcium 7.2 (*)    GFR, Estimated 28 (*)    All other components within normal limits  SEDIMENTATION RATE - Abnormal; Notable for the following components:   Sed Rate 80 (*)    All other components within normal limits  BODY FLUID CULTURE W GRAM STAIN  C-REACTIVE PROTEIN  GLUCOSE, BODY FLUID OTHER            PROTEIN, BODY FLUID (OTHER)  SYNOVIAL CELL COUNT + DIFF, W/ CRYSTALS    EKG None  Radiology US Venous Img Lower Unilateral Left  Result Date: 10/07/2021 CLINICAL DATA:  Left knee pain EXAM: LEFT LOWER EXTREMITY VENOUS DOPPLER ULTRASOUND TECHNIQUE: Gray-scale sonography with compression, as well as color and duplex ultrasound, were performed to evaluate the deep venous system(s) from the level of the common femoral vein through the popliteal and proximal calf veins. COMPARISON:  None. FINDINGS: VENOUS Normal compressibility of the common femoral, superficial femoral, and popliteal veins, as well as the visualized calf veins. Visualized portions of profunda femoral vein and great saphenous vein unremarkable. No filling defects to suggest DVT on grayscale or color Doppler imaging. Doppler waveforms show normal direction of venous flow, normal respiratory plasticity and response to augmentation. Limited views of the contralateral common femoral vein are unremarkable. OTHER 3.7 x 1.3 x 2.7 cm fluid collection in the left popliteal fossa is likely related to a Baker cyst. Limitations: none IMPRESSION: No left lower extremity DVT. Electronically Signed   By: Acquanetta Belling M.D.   On: 10/07/2021 12:33   DG Knee Complete 4 Views Left  Result Date: 10/07/2021 CLINICAL DATA:  Leg pain and swelling without trauma EXAM: LEFT KNEE - COMPLETE 4+ VIEW COMPARISON:  None. FINDINGS: No acute fracture or dislocation. Moderate  patellofemoral and mild medial/lateral compartment joint space narrowing and subchondral sclerosis. Moderate suprapatellar joint effusion. Vascular calcifications. 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 Electronically Signed   By: Jeronimo Greaves M.D.   On: 10/07/2021 13:17    Procedures .Joint Aspiration/Arthrocentesis  Date/Time: 10/07/2021 1:28 PM Performed by: Melene Plan, DO Authorized by: Melene Plan, DO   Consent:    Consent obtained:  Verbal   Consent given by:  Patient and spouse   Risks, benefits, and alternatives were discussed: yes     Risks discussed:  Bleeding, infection, incomplete drainage and nerve damage   Alternatives discussed:  No treatment, delayed treatment and alternative treatment Universal protocol:    Procedure explained and questions answered to patient or proxy's satisfaction: yes     Imaging studies available: yes     Patient identity confirmed:  Verbally with patient Location:    Location:  Knee   Knee:  L knee Anesthesia:    Anesthesia method:  Local infiltration   Local anesthetic:  Lidocaine 2% WITH epi Procedure details:    Preparation: Patient was prepped and draped in usual sterile fashion     Needle gauge:  22 G   Ultrasound guidance: no     Approach:  Lateral   Aspirate amount:  30   Aspirate characteristics:  Cloudy, yellow and blood-tinged   Steroid injected: no     Specimen collected: no   Post-procedure details:    Dressing:  Adhesive bandage   Procedure completion:  Tolerated well, no immediate complications   Medications Ordered in ED Medications  acetaminophen (TYLENOL) tablet 1,000 mg (1,000 mg Oral Given 10/07/21 1207)  oxyCODONE (Oxy IR/ROXICODONE) immediate release tablet 5 mg (5 mg Oral Given 10/07/21 1206)  colchicine tablet 1.2 mg (1.2 mg Oral Given 10/07/21 1209)    Followed by  colchicine tablet 0.6 mg (  0.6 mg Oral Given 10/07/21 1311)  lidocaine (PF) (XYLOCAINE) 1 %  injection 30 mL (10 mLs Infiltration Given 10/07/21 1312)    ED Course  I have reviewed the triage vital signs and the nursing notes.  Pertinent labs & imaging results that were available during my care of the patient were reviewed by me and considered in my medical decision making (see chart for details).    MDM Rules/Calculators/A&P                           87 yo M with a chief complaint of left knee pain.  Most likely the patient has gout based on his history though with extension of swelling down the leg will obtain a DVT study.  We will attempt arthrocentesis as the patient is never had gout in that joint before.  Inflammatory markers.  Reassess.  Signed out to Dr Stevie Kern, please see his note for further details of care.   The patients results and plan were reviewed and discussed.   Any x-rays performed were independently reviewed by myself.   Differential diagnosis were considered with the presenting HPI.  Medications  acetaminophen (TYLENOL) tablet 1,000 mg (1,000 mg Oral Given 10/07/21 1207)  oxyCODONE (Oxy IR/ROXICODONE) immediate release tablet 5 mg (5 mg Oral Given 10/07/21 1206)  colchicine tablet 1.2 mg (1.2 mg Oral Given 10/07/21 1209)    Followed by  colchicine tablet 0.6 mg (0.6 mg Oral Given 10/07/21 1311)  lidocaine (PF) (XYLOCAINE) 1 % injection 30 mL (10 mLs Infiltration Given 10/07/21 1312)    Vitals:   10/07/21 1415 10/07/21 1430 10/07/21 1445 10/07/21 1500  BP: 139/66 134/74 126/72 134/66  Pulse: (!) 59 (!) 59 (!) 58 (!) 57  Resp:    18  Temp:      TempSrc:      SpO2: 98% 99% 100% 99%  Weight:      Height:        Final diagnoses:  Acute pain of left knee    Final Clinical Impression(s) / ED Diagnoses Final diagnoses:  Acute pain of left knee    Rx / DC Orders ED Discharge Orders     None        Melene Plan, DO 10/07/21 1521

## 2021-10-07 NOTE — Discharge Instructions (Signed)
Follow-up with your primary care doctor and orthopedics.  Take Tylenol for pain control.  For breakthrough pain can also take the prescribed oxycodone.  Note this can make you drowsy and should not be taken when driving or operating heavy machinery.  Come back to ER if you develop worsening swelling, pain, fever, inability to walk or other new concerning symptom.

## 2021-10-07 NOTE — ED Notes (Signed)
ED Provider at bedside. For arthrocentesis

## 2021-10-07 NOTE — ED Provider Notes (Signed)
Signout note  87 year old gentleman presented to ER with concern for left knee pain and swelling.  Joint aspirated and fluid studies sent.  At time of signout, plan to follow-up joint studies, concern for possible septic joint versus gout.  6:22 PM reassessed patient, discussed the results.  Findings are consistent with gout.  Pain is well controlled.  I discussed potential treatment options with patient.  Patient states that he has previously had bad reaction to steroids causing significant issues managing his blood sugars and does not want steroid at present.  Discussed the case with pharmacy, Rolly Salter; patient has already received 1.2 mg of colchicine followed by 0.6 mg.  Due to his renal function, he has reached the maximum recommended dose of colchicine should not receive any further colchicine for the next couple weeks.  Will recommend that patient follow-up with primary and orthopedics, recommend he use Tylenol for pain control and provided short Rx for oxycodone for breakthrough pain.  Reviewed return precautions and discharged home.   Milagros Loll, MD 10/07/21 [phone]

## 2021-10-07 NOTE — ED Notes (Signed)
Pain/swelling to left knee down to ankle.  No history of DVT, denies recent injury

## 2021-10-07 NOTE — ED Notes (Signed)
ED Provider at bedside. 

## 2021-10-08 LAB — PROTEIN, BODY FLUID (OTHER): Total Protein, Body Fluid Other: 3.8 g/dL

## 2021-10-08 LAB — GLUCOSE, BODY FLUID OTHER: Glucose, Body Fluid Other: 94 mg/dL

## 2021-10-10 LAB — BODY FLUID CULTURE W GRAM STAIN: Culture: NO GROWTH

## 2021-10-14 ENCOUNTER — Encounter: Payer: Self-pay | Admitting: Orthopaedic Surgery

## 2021-10-14 ENCOUNTER — Other Ambulatory Visit: Payer: Self-pay

## 2021-10-14 ENCOUNTER — Ambulatory Visit: Payer: Medicare PPO | Admitting: Orthopaedic Surgery

## 2021-10-14 DIAGNOSIS — M109 Gout, unspecified: Secondary | ICD-10-CM | POA: Diagnosis not present

## 2021-10-14 NOTE — Progress Notes (Signed)
   Office Visit Note   Patient: Juan Boyd           Date of Birth: February 07, 1933           MRN: [id number] Visit Date: 10/14/2021              Requested by: Loyal Jacobson, MD [address] [place],  [place] [address] PCP: Loyal Jacobson, MD   Assessment & Plan: Visit Diagnoses:  1. Acute gout of left knee, unspecified cause     Plan: Impression is resolved pseudogout left knee.  At this point, I believe the patient needs to be started on a prophylactic medication, but due to poor renal function I would like to have this managed by his primary care provider.  He is scheduled to see him next week and we will further discuss this.  He will follow-up with [place] as needed.  Call with concerns or questions.  Follow-Up Instructions: Return if symptoms worsen or fail to improve.   Orders:  No orders of the defined types were placed in this encounter.  No orders of the defined types were placed in this encounter.     Procedures: No procedures performed   Clinical Data: No additional findings.   Subjective: Chief Complaint  Patient presents with   Left Knee - New Patient (Initial Visit)    HPI patient is a pleasant 87 year old gentleman who comes in today for follow-up of his left knee.  About a week ago, he woke up with terrible pain and swelling to the left knee.  He was seen in the ED where his left knee was aspirated.  Fluid came back positive for pseudogout.  He was giving colchicine in the ED.  Per pharmacy, due to renal function they recommended that he does not continue this for several more weeks.  He is here today for follow-up.  His knee pain has significantly improved.  He is not taking any medication for this.  He does note that he has had several gouty attacks in the past but is not on prophylactic medication.  Review of Systems as detailed in HPI.  All others reviewed and are negative.   Objective: Vital Signs: There were no vitals taken for this  visit.  Physical Exam well-developed well-nourished gentleman in no acute distress.  Alert and oriented x3.  Ortho Exam left knee exam shows no effusion.  No warmth and no erythema.  Range of motion 0 to 115 degrees.  No joint line tenderness.  He is neurovascular intact distally.  Specialty Comments:  No specialty comments available.  Imaging: No new imaging   PMFS History: There are no problems to display for this patient.  Past Medical History:  Diagnosis Date   Diabetes mellitus without complication (HCC)    High cholesterol    Hypertension     History reviewed. No pertinent family history.  Past Surgical History:  Procedure Laterality Date   CORONARY ANGIOPLASTY WITH STENT PLACEMENT     EYE SURGERY     Social History   Occupational History   Not on file  Tobacco Use   Smoking status: Former   Smokeless tobacco: Never  Substance and Sexual Activity   Alcohol use: No   Drug use: No   Sexual activity: Not on file

## 2021-12-09 ENCOUNTER — Ambulatory Visit (INDEPENDENT_AMBULATORY_CARE_PROVIDER_SITE_OTHER): Payer: Medicare PPO | Admitting: Orthopaedic Surgery

## 2021-12-09 ENCOUNTER — Ambulatory Visit (INDEPENDENT_AMBULATORY_CARE_PROVIDER_SITE_OTHER): Payer: Medicare PPO

## 2021-12-09 ENCOUNTER — Other Ambulatory Visit: Payer: Self-pay

## 2021-12-09 DIAGNOSIS — G8929 Other chronic pain: Secondary | ICD-10-CM

## 2021-12-09 DIAGNOSIS — M25511 Pain in right shoulder: Secondary | ICD-10-CM

## 2021-12-09 MED ORDER — TRAMADOL HCL 50 MG PO TABS: 50.0000 mg | ORAL_TABLET | Freq: Three times a day (TID) | ORAL | 2 refills | Status: DC | PRN

## 2021-12-09 NOTE — Progress Notes (Signed)
° °  Office Visit Note   Patient: Juan Boyd           Date of Birth: 1933/04/22           MRN: [id number] Visit Date: 12/09/2021              Requested by: Loyal Jacobson, MD [address] [place],  [place] [address] PCP: Loyal Jacobson, MD   Assessment & Plan: Visit Diagnoses:  1. Chronic right shoulder pain     Plan: Impression is right shoulder rotator cuff arthropathy.  I have discussed referral to Dr. Alvester Morin for glenohumeral cortisone injection for which he would like to proceed.  Have also called in prescription of tramadol to take in the meantime.  He will follow-up with [place] as needed.  Follow-Up Instructions: Return if symptoms worsen or fail to improve.   Orders:  Orders Placed This Encounter  Procedures   XR Shoulder Right   Meds ordered this encounter  Medications   traMADol (ULTRAM) 50 MG tablet    Sig: Take 1 tablet (50 mg total) by mouth 3 (three) times daily as needed.    Dispense:  30 tablet    Refill:  2      Procedures: No procedures performed   Clinical Data: No additional findings.   Subjective: Chief Complaint  Patient presents with   Right Shoulder - Follow-up    HPI patient is a pleasant 87 year old gentleman who comes in today with right shoulder pain for the past several years.  The pain he has is to the top of the shoulder and deep within.  He has increased pain with any movement of the shoulder in addition to when he is trying to sleep at night.  He has been taking Tylenol with minimal relief.  He does note a cortisone injection several years ago which did seem to help.  Review of Systems as detailed in HPI.  All others reviewed and are negative.   Objective: Vital Signs: There were no vitals taken for this visit.  Physical Exam well-developed well-nourished gentleman in no acute distress.  Alert and oriented x3.  Ortho Exam right shoulder exam reveals forward flexion to 90 degrees, abduction to 80 degrees, external  rotation to approximately 20 degrees, internal rotation to L5.  Negative empty can test.  He has 3 out of 5 strength throughout.  He is neurovascular intact distally.  Specialty Comments:  No specialty comments available.  Imaging: No results found.   PMFS History: There are no problems to display for this patient.  Past Medical History:  Diagnosis Date   Diabetes mellitus without complication (HCC)    High cholesterol    Hypertension     No family history on file.  Past Surgical History:  Procedure Laterality Date   CORONARY ANGIOPLASTY WITH STENT PLACEMENT     EYE SURGERY     Social History   Occupational History   Not on file  Tobacco Use   Smoking status: Former   Smokeless tobacco: Never  Substance and Sexual Activity   Alcohol use: No   Drug use: No   Sexual activity: Not on file

## 2022-01-05 ENCOUNTER — Ambulatory Visit: Payer: Self-pay

## 2022-01-05 ENCOUNTER — Ambulatory Visit (INDEPENDENT_AMBULATORY_CARE_PROVIDER_SITE_OTHER): Payer: Medicare PPO | Admitting: Physical Medicine and Rehabilitation

## 2022-01-05 ENCOUNTER — Encounter: Payer: Self-pay | Admitting: Physical Medicine and Rehabilitation

## 2022-01-05 ENCOUNTER — Other Ambulatory Visit: Payer: Self-pay

## 2022-01-05 DIAGNOSIS — G8929 Other chronic pain: Secondary | ICD-10-CM

## 2022-01-05 DIAGNOSIS — M25511 Pain in right shoulder: Secondary | ICD-10-CM | POA: Diagnosis not present

## 2022-01-05 NOTE — Progress Notes (Signed)
Pt state right shoulder pain. Pt state lifting items makes the pain worse. Pt state he takes pain meds and use icey hot to help ease his pain.  Numeric Pain Rating Scale and Functional Assessment Average Pain 8   In the last MONTH (on 0-10 scale) has pain interfered with the following?  1. General activity like being  able to carry out your everyday physical activities such as walking, climbing stairs, carrying groceries, or moving a chair?  Rating(10)   -BT, -Dye Allergies.

## 2022-01-05 NOTE — Progress Notes (Signed)
° °  Cavion Ridley - 88 y.o. male MRN [id number]  Date of birth: 1933-04-17  Office Visit Note: Visit Date: 01/05/2022 PCP: Jefm Petty, MD Referred by: Leandrew Koyanagi, MD  Subjective: Chief Complaint  Patient presents with   Right Shoulder - Pain   HPI:  Gajuan Yoho is a 88 y.o. male who comes in today at the request of Dr. Eduard Roux for planned Right anesthetic glenohumeral arthrogram with fluoroscopic guidance.  The patient has failed conservative care including home exercise, medications, time and activity modification.  This injection will be diagnostic and hopefully therapeutic.  Please see requesting physician notes for further details and justification.   ROS Otherwise per HPI.  Assessment & Plan: Visit Diagnoses:    ICD-10-CM   1. Chronic right shoulder pain  M25.511 XR C-ARM NO REPORT   G89.29 Large Joint Inj: R glenohumeral      Plan: No additional findings.   Meds & Orders: No orders of the defined types were placed in this encounter.   Orders Placed This Encounter  Procedures   Large Joint Inj: R glenohumeral   XR C-ARM NO REPORT    Follow-up: Return for visit to requesting provider as needed.   Procedures: Large Joint Inj: R glenohumeral on 01/05/2022 1:18 PM Indications: pain and diagnostic evaluation Details: 22 G 3.5 in needle, fluoroscopy-guided anteromedial approach  Arthrogram: No  Medications: 40 mg triamcinolone acetonide 40 MG/ML; 5 mL bupivacaine 0.25 % Outcome: tolerated well, no immediate complications  There was excellent flow of contrast producing a partial arthrogram of the glenohumeral joint. The patient did have relief of symptoms during the anesthetic phase of the injection. Procedure, treatment alternatives, risks and benefits explained, specific risks discussed. Consent was given by the patient. Immediately prior to procedure a time out was called to verify the correct patient, procedure, equipment, support staff and site/side marked as  required. Patient was prepped and draped in the usual sterile fashion.         Clinical History: No specialty comments available.     Objective:  VS:  HT:     WT:    BMI:      BP:    HR: bpm   TEMP: ( )   RESP:  Physical Exam   Imaging: No results found.

## 2022-01-27 ENCOUNTER — Emergency Department (HOSPITAL_BASED_OUTPATIENT_CLINIC_OR_DEPARTMENT_OTHER)
Admission: EM | Admit: 2022-01-27 | Discharge: 2022-01-27 | Disposition: A | Discharge status: Home / Self Care | Payer: Medicare PPO | Attending: Emergency Medicine | Admitting: Emergency Medicine

## 2022-01-27 ENCOUNTER — Emergency Department (HOSPITAL_BASED_OUTPATIENT_CLINIC_OR_DEPARTMENT_OTHER): Payer: Medicare PPO

## 2022-01-27 ENCOUNTER — Encounter (HOSPITAL_BASED_OUTPATIENT_CLINIC_OR_DEPARTMENT_OTHER): Payer: Self-pay

## 2022-01-27 ENCOUNTER — Other Ambulatory Visit: Payer: Self-pay

## 2022-01-27 DIAGNOSIS — R519 Headache, unspecified: Secondary | ICD-10-CM

## 2022-01-27 DIAGNOSIS — Z7982 Long term (current) use of aspirin: Secondary | ICD-10-CM | POA: Insufficient documentation

## 2022-01-27 DIAGNOSIS — Z79899 Other long term (current) drug therapy: Secondary | ICD-10-CM | POA: Diagnosis not present

## 2022-01-27 DIAGNOSIS — Z7901 Long term (current) use of anticoagulants: Secondary | ICD-10-CM | POA: Diagnosis not present

## 2022-01-27 LAB — CBC WITH DIFFERENTIAL/PLATELET
Abs Immature Granulocytes: 0.01 10*3/uL (ref 0.00–0.07)
Basophils Absolute: 0 10*3/uL (ref 0.0–0.1)
Basophils Relative: 1 %
Eosinophils Absolute: 0.2 10*3/uL (ref 0.0–0.5)
Eosinophils Relative: 3 %
HCT: 36.4 % — ABNORMAL LOW (ref 39.0–52.0)
Hemoglobin: 11.9 g/dL — ABNORMAL LOW (ref 13.0–17.0)
Immature Granulocytes: 0 %
Lymphocytes Relative: 31 %
Lymphs Abs: 2 10*3/uL (ref 0.7–4.0)
MCH: 26.9 pg (ref 26.0–34.0)
MCHC: 32.7 g/dL (ref 30.0–36.0)
MCV: 82.2 fL (ref 80.0–100.0)
Monocytes Absolute: 0.6 10*3/uL (ref 0.1–1.0)
Monocytes Relative: 9 %
Neutro Abs: 3.7 10*3/uL (ref 1.7–7.7)
Neutrophils Relative %: 56 %
Platelets: 134 10*3/uL — ABNORMAL LOW (ref 150–400)
RBC: 4.43 MIL/uL (ref 4.22–5.81)
RDW: 15.5 % (ref 11.5–15.5)
WBC: 6.6 10*3/uL (ref 4.0–10.5)
nRBC: 0 % (ref 0.0–0.2)

## 2022-01-27 LAB — COMPREHENSIVE METABOLIC PANEL
ALT: 22 U/L (ref 0–44)
AST: 21 U/L (ref 15–41)
Albumin: 3.8 g/dL (ref 3.5–5.0)
Alkaline Phosphatase: 88 U/L (ref 38–126)
Anion gap: 9 (ref 5–15)
BUN: 29 mg/dL — ABNORMAL HIGH (ref 8–23)
CO2: 23 mmol/L (ref 22–32)
Calcium: 7.6 mg/dL — ABNORMAL LOW (ref 8.9–10.3)
Chloride: 103 mmol/L (ref 98–111)
Creatinine, Ser: 2.26 mg/dL — ABNORMAL HIGH (ref 0.61–1.24)
GFR, Estimated: 28 mL/min — ABNORMAL LOW (ref >60)
Glucose, Bld: 145 mg/dL — ABNORMAL HIGH (ref 70–99)
Potassium: 4.1 mmol/L (ref 3.5–5.1)
Sodium: 135 mmol/L (ref 135–145)
Total Bilirubin: 0.4 mg/dL (ref 0.3–1.2)
Total Protein: 6.9 g/dL (ref 6.5–8.1)

## 2022-01-27 LAB — SEDIMENTATION RATE: Sed Rate: 35 mm/hr — ABNORMAL HIGH (ref 0–16)

## 2022-01-27 MED ORDER — ACETAMINOPHEN 500 MG PO TABS
1000.0000 mg | ORAL_TABLET | Freq: Once | ORAL | Status: AC
  Administered 2022-01-27: 1000 mg via ORAL
  Filled 2022-01-27: qty 2

## 2022-01-27 NOTE — Discharge Instructions (Signed)
If you develop continued, recurrent, or worsening headache, fever, neck stiffness, vomiting, blurry or double vision, weakness or numbness in your arms or legs, trouble speaking, or any other new/concerning symptoms then return to the ER for evaluation.  

## 2022-01-27 NOTE — ED Provider Notes (Signed)
[facility] Provider Note   CSN: [id number] Arrival date & time: 01/27/22  1511     History  Chief Complaint  Patient presents with   Headache    Juan Boyd is a 88 y.o. male.  HPI 88 year old male presents with a headache. It is left frontal. Patient has tried a "pain pill" (daughter thinks it was tramadol) with not much relief.  Headache seems to come and go.  Originally started 2 days ago according to the daughter at the bedside.  Yesterday when she called him he seemed to not have any headache.  However the headache was again present today so she brought him in here.  Patient points to his left frontal area as the source of pain and states its been tender.  No fevers, vomiting, neck pain, or weakness/numbness in the extremities.  No vision complaints.  Rates the pain as severe.  Daughter notes that he had a headache in a very similar location just about a year ago and was treated for presumptive shingles with some steroids but never actually showed a rash.  Home Medications Prior to Admission medications   Medication Sig Start Date End Date Taking? Authorizing Provider  amLODipine (NORVASC) 2.5 MG tablet Take 2.5 mg by mouth daily. 07/01/19   [provider]  aspirin EC 81 MG tablet Take by mouth.    [provider]  carvedilol (COREG) 6.25 MG tablet Take 6.25 mg by mouth 2 (two) times daily with a meal.    [provider]  clopidogrel (PLAVIX) 75 MG tablet TAKE ONE TABLET BY MOUTH ONCE DAILY 12/09/15   [provider]  colchicine 0.6 MG tablet Take 1 tablet (0.6 mg total) by mouth daily. 04/11/15   Sherwood Gambler, MD  EUTHYROX 50 MCG tablet Take 50 mcg by mouth at bedtime. 09/03/20   [provider]  furosemide (LASIX) 20 MG tablet Take 1 tablet (20 mg total) by mouth daily. 06/17/15   Blanchie Dessert, MD  GARLIC PO Take 1 tablet by mouth daily.    [provider]  glipiZIDE (GLUCOTROL XL) 5 MG 24  hr tablet Take 5 mg by mouth daily.    [provider]  HYDROcodone-acetaminophen (NORCO) 5-325 MG per tablet Take 1 tablet by mouth every 6 (six) hours as needed for severe pain. 04/11/15   Sherwood Gambler, MD  JANUVIA 50 MG tablet Take 50 mg by mouth daily. 09/04/20   [provider]  Omega-3 Fatty Acids (FISH OIL PO) Take 1 capsule by mouth daily.    [provider]  ondansetron (ZOFRAN ODT) 8 MG disintegrating tablet 69m ODT q4 hours prn nausea 08/08/19   DVeryl Speak MD  potassium chloride SA (K-DUR,KLOR-CON) 20 MEQ tablet Take 20 mEq by mouth 2 (two) times daily.    [provider]  pravastatin (PRAVACHOL) 40 MG tablet Take 40 mg by mouth daily.    [provider]  rosuvastatin (CRESTOR) 40 MG tablet Take 40 mg by mouth daily. 08/02/20   [provider]  traMADol (ULTRAM) 50 MG tablet Take 1 tablet (50 mg total) by mouth 3 (three) times daily as needed. 12/09/21   SAundra Dubin PA-C  lisinopril (PRINIVIL,ZESTRIL) 20 MG tablet Take 20 mg by mouth daily.  12/11/20  [provider]      Allergies    Esomeprazole magnesium and Oxycodone    Review of Systems   Review of Systems  Constitutional:  Negative for fever.  Eyes:  Negative for visual disturbance.  Gastrointestinal:  Negative for vomiting.  Musculoskeletal:  Negative for neck pain and neck stiffness.  Neurological:  Positive for headaches. Negative for weakness and numbness.  Psychiatric/Behavioral:  Negative for confusion.    Physical Exam Updated Vital Signs BP (!) 144/80 (BP Location: Right Arm)    Pulse 63    Temp 98.3 F (36.8 C) (Oral)    Resp 16    Ht 5' 4"  (1.626 m)    Wt 75.8 kg    SpO2 100%    BMI 28.67 kg/m  Physical Exam Vitals and nursing note reviewed.  Constitutional:      Appearance: He is well-developed.  HENT:     Head: Normocephalic and atraumatic.   Eyes:     Extraocular Movements: Extraocular movements intact.     Pupils: Pupils are  equal, round, and reactive to light.  Cardiovascular:     Rate and Rhythm: Normal rate and regular rhythm.     Heart sounds: Normal heart sounds.  Pulmonary:     Effort: Pulmonary effort is normal.     Breath sounds: Normal breath sounds.  Abdominal:     General: There is no distension.  Musculoskeletal:     Cervical back: Normal range of motion. No rigidity.  Skin:    General: Skin is warm and dry.  Neurological:     Mental Status: He is alert.    ED Results / Procedures / Treatments   Labs (all labs ordered are listed, but only abnormal results are displayed) Labs Reviewed  COMPREHENSIVE METABOLIC PANEL - Abnormal; Notable for the following components:      Result Value   Glucose, Bld 145 (*)    BUN 29 (*)    Creatinine, Ser 2.26 (*)    Calcium 7.6 (*)    GFR, Estimated 28 (*)    All other components within normal limits  SEDIMENTATION RATE - Abnormal; Notable for the following components:   Sed Rate 35 (*)    All other components within normal limits  CBC WITH DIFFERENTIAL/PLATELET - Abnormal; Notable for the following components:   Hemoglobin 11.9 (*)    HCT 36.4 (*)    Platelets 134 (*)    All other components within normal limits    EKG None  Radiology CT Head Wo Contrast  Result Date: 01/27/2022 CLINICAL DATA:  Sudden onset headache.  Severe. EXAM: CT HEAD WITHOUT CONTRAST TECHNIQUE: Contiguous axial images were obtained from the base of the skull through the vertex without intravenous contrast. RADIATION DOSE REDUCTION: This exam was performed according to the departmental dose-optimization program which includes automated exposure control, adjustment of the mA and/or kV according to patient size and/or use of iterative reconstruction technique. COMPARISON:  CT head 08/17/2012 FINDINGS: Brain: Ventricle size and cerebral volume normal for age. Hypodensity right anterior basal ganglia unchanged compatible with chronic infarction. Mild patchy white matter  hypodensity bilaterally with progression since 2013. This appears to be chronic microvascular ischemia. Negative for acute infarct, hemorrhage, mass Vascular: Negative for hyperdense vessel Skull: Negative Sinuses/Orbits: Paranasal sinuses clear. Bilateral cataract extraction Other: None IMPRESSION: No acute abnormality. Chronic microvascular ischemic change with progression since 2013. Electronically Signed   By: Franchot Gallo M.D.   On: 01/27/2022 16:47    Procedures Procedures    Medications Ordered in ED Medications  acetaminophen (TYLENOL) tablet 1,000 mg (1,000 mg Oral Given 01/27/22 1623)    ED Course/ Medical Decision Making/ A&P  Medical Decision Making Amount and/or Complexity of Data Reviewed Labs: ordered. Radiology: ordered.  Risk OTC drugs.   Patient is well-appearing.  He feels better with Tylenol.  Unclear cause of his headache.  His scalp is tender though there is no rash.  I see where he was treated before for zoster in 2021 by his PCP but he never had a rash then and a rash never developed, so I think this is unlikely the cause today.  No fevers or meningismus.  No neurodeficits.  At this point, with a negative work-up, I think he can be discharged home.  He has pain somewhat near his temporal artery but not really over it but with and ESR that is only minimally elevated I think temporal arteritis is highly unlikely.  Discussed plan with daughter and patient and they are okay going home.  Labs reviewed/interpreted by myself and show a chronic kidney disease that is unchanged as well as a sed rate of only 35 and a normal WBC.  CT head reviewed/interpreted by myself and shows no obvious head bleed, mass, etc.        Final Clinical Impression(s) / ED Diagnoses Final diagnoses:  Left-sided headache    Rx / DC Orders ED Discharge Orders     None         Sherwood Gambler, MD 01/27/22 1815

## 2022-01-27 NOTE — ED Triage Notes (Signed)
Pt c/o intermittent pain to left forehead x 2-3 months-increase pain to touch-denies injury-NAD-steady gait

## 2022-01-27 NOTE — ED Notes (Signed)
Patient returned from CT

## 2022-01-31 MED ORDER — TRIAMCINOLONE ACETONIDE 40 MG/ML IJ SUSP
40.0000 mg | INTRAMUSCULAR | Status: AC | PRN
  Administered 2022-01-05: 40 mg via INTRA_ARTICULAR

## 2022-01-31 MED ORDER — BUPIVACAINE HCL 0.25 % IJ SOLN
5.0000 mL | INTRAMUSCULAR | Status: AC | PRN
  Administered 2022-01-05: 5 mL via INTRA_ARTICULAR

## 2022-04-01 ENCOUNTER — Telehealth: Payer: Self-pay | Admitting: Orthopaedic Surgery

## 2022-04-01 NOTE — Telephone Encounter (Signed)
Pt's daughter Pattricia Boss called asking for sooner appt  for pt. She states pt's left shoulder is swollen. Pattricia Boss states pt can see Dr. Roda Shutters or PA Dub Mikes. Please call Pattricia Boss at [phone]. ?

## 2022-04-02 NOTE — Telephone Encounter (Signed)
Sure she can see lindsey next week

## 2022-04-06 NOTE — Telephone Encounter (Signed)
Appt made

## 2022-04-07 ENCOUNTER — Ambulatory Visit (INDEPENDENT_AMBULATORY_CARE_PROVIDER_SITE_OTHER): Payer: Medicare PPO

## 2022-04-07 ENCOUNTER — Encounter: Payer: Self-pay | Admitting: Physician Assistant

## 2022-04-07 ENCOUNTER — Ambulatory Visit: Payer: Medicare PPO | Admitting: Physician Assistant

## 2022-04-07 DIAGNOSIS — M25512 Pain in left shoulder: Secondary | ICD-10-CM | POA: Diagnosis not present

## 2022-04-07 MED ORDER — HYDROCODONE-ACETAMINOPHEN 5-325 MG PO TABS: ORAL_TABLET | ORAL | 0 refills | Status: DC

## 2022-04-07 NOTE — Progress Notes (Signed)
? ?Office Visit Note ?  ?Patient: Juan Boyd           ?Date of Birth: 12-20-1933           ?MRN: [id number] ?Visit Date: 04/07/2022 ?             ?Requested by: Loyal Jacobson, MD ?[address] ?[address] ?[place],  [place] [address] ?PCP: Loyal Jacobson, MD ? ? ?Assessment & Plan: ?Visit Diagnoses:  ?1. Acute pain of left shoulder   ? ? 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  At this point, I believe the patient's symptoms are primarily coming from the [facility] joint.  I have discussed referral to Dr. Alvester Morin for [employer] joint injection, but he notes that he is not interested at this time.  He would like to try a short course of pain medication.  If he does not have significant relief he will call us for referral to Dr. Alvester Morin. ? ?Follow-Up Instructions: Return if symptoms worsen or fail to improve.  ? ?Orders:  ?Orders Placed This Encounter  ?Procedures  ? XR Shoulder Left  ? ?No orders of the defined types were placed in this encounter. ? ? ? ? Procedures: ?No procedures performed ? ? ?Clinical Data: ?No additional findings. ? ? ?Subjective: ?Chief Complaint  ?Patient presents with  ? Right Shoulder - Pain  ? Left Shoulder - Pain  ? ? ?HPI patient is a pleasant 88 year old gentleman who comes in today with left shoulder pain for the past 2 weeks.  He denies any injury or change in activity.  The pain he has is primarily the top of the shoulder to the [facility] joint.  He initially had associated swelling but this is since resolved.  He has been taking Tylenol without significant relief.  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  He was recently seen by Dr. Alvester Morin for a right shoulder joint injection which significantly helped. ? ?Review of Systems as detailed in HPI.  All others reviewed and are negative. ? ? ?Objective: ?Vital Signs: There were no vitals taken for this visit. ? ?Physical Exam well-developed and well-nourished gentleman in no acute  distress.  Alert and oriented x3. ? ?Ortho Exam left shoulder exam shows forward flexion to approximately 120 degrees.  He has slight limitation with internal and external rotation.  Negative empty can test.  Moderate tenderness to the [facility] joint.  No skin changes.  No swelling.  Positive cross body adduction.  He is neurovascular intact distally. ? ?Specialty Comments:  ?No specialty comments available. ? ?Imaging: ?XR Shoulder Left ? ?Result Date: 04/07/2022 ?Impression is advanced degenerative changes with osteophyte formation to the [facility] joint in addition to moderate degenerative changes to the glenohumeral joint  ? ? ?PMFS History: ?There are no problems to display for this patient. ? ?Past Medical History:  ?Diagnosis Date  ? Diabetes mellitus without complication (HCC)   ? High cholesterol   ? Hypertension   ?  ?History reviewed. No pertinent family history.  ?Past Surgical History:  ?Procedure Laterality Date  ? CORONARY ANGIOPLASTY WITH STENT PLACEMENT    ? EYE SURGERY    ? ?Social History  ? ?Occupational History  ? Not on file  ?Tobacco Use  ? Smoking status: Former  ? Smokeless tobacco: Never  ?Vaping Use  ? Vaping Use: Never used  ?Substance and Sexual Activity  ? Alcohol use: No  ? Drug use: No  ?  Sexual activity: Not on file  ? ? ? ? ? ? ?

## 2022-04-24 IMAGING — CT CT HEAD W/O CM
3 series · 14 of 47 positions shown, 16 images · non-contrast
Comparison: CT head 08/17/2012

CLINICAL DATA: Sudden onset headache.  Severe.



[Series 2: head wo · axial · 0.46mm/px · z∈[+917,+1042]mm · 8 of 31 slices shown, 10 images]
[im 3/31  brain]
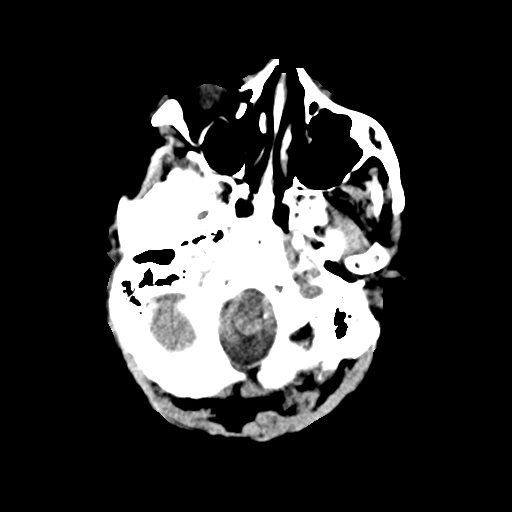
[im 3/31  bone]
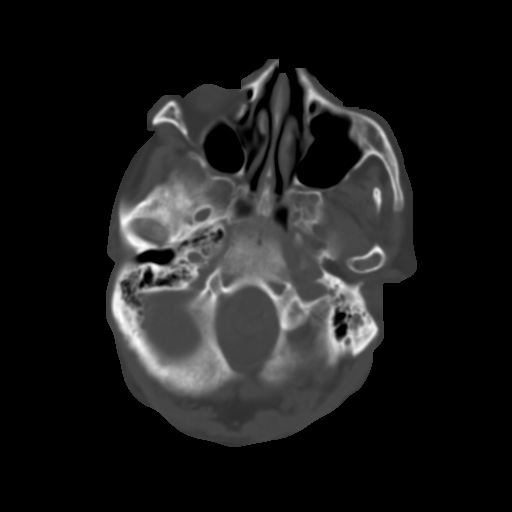
[im 7/31  brain]
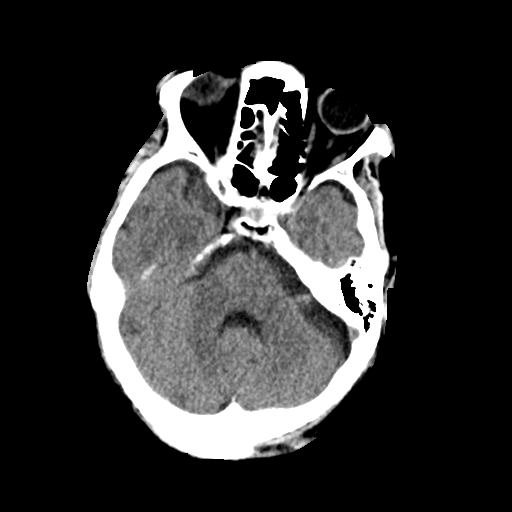
[im 10/31  brain]
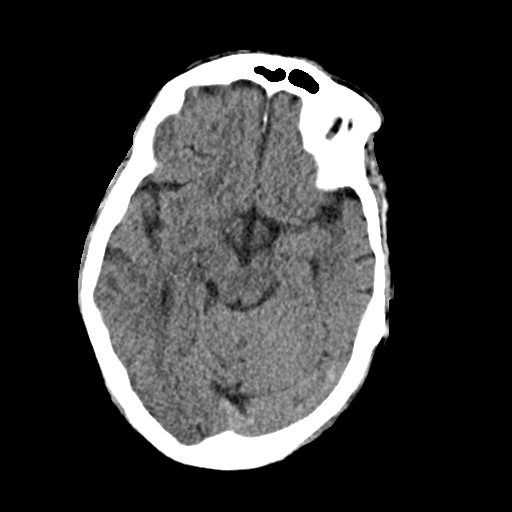
[im 14/31  brain]
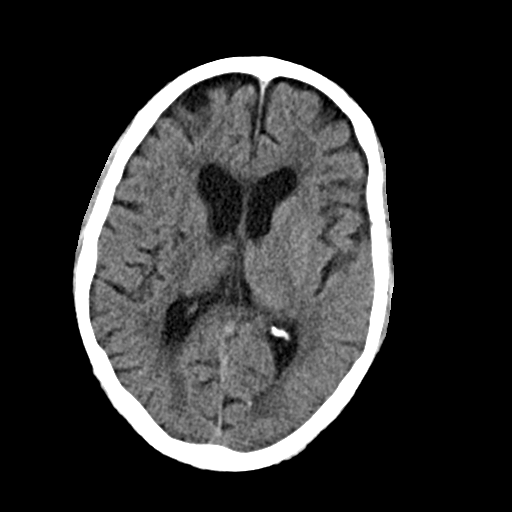
[im 17/31  brain]
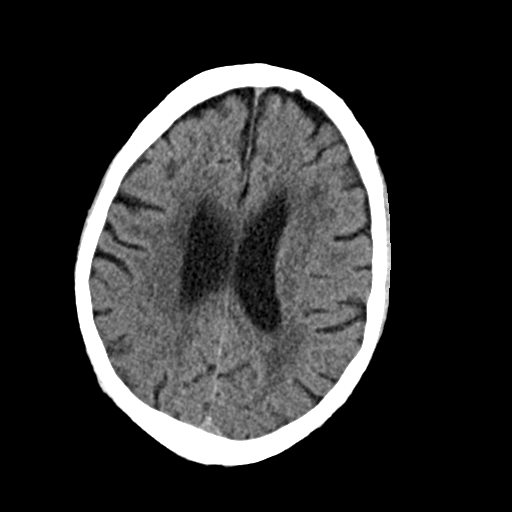
[im 17/31  bone]
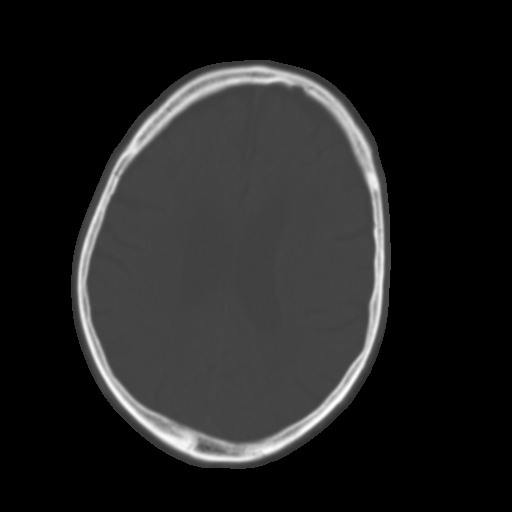
[im 21/31  brain]
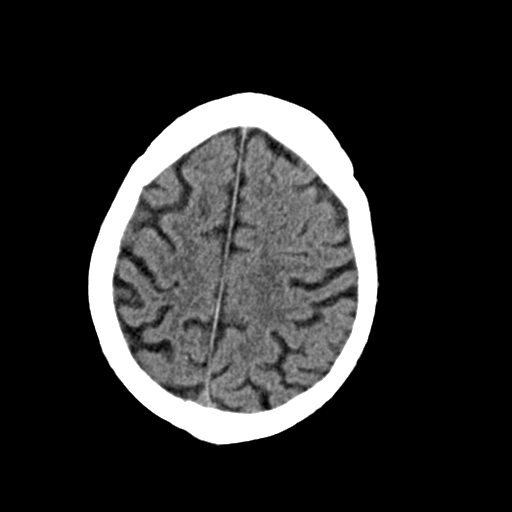
[im 24/31  brain]
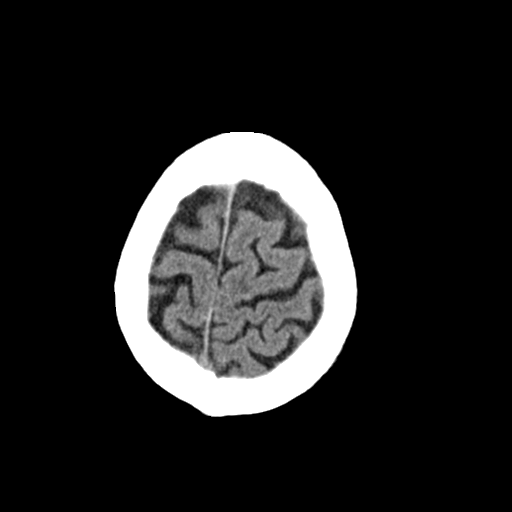
[im 28/31  brain]
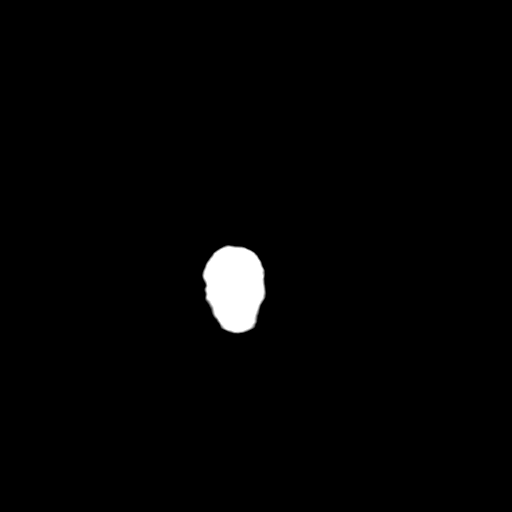

[Series 4: coronal soft · coronal · 0.30mm/px · 3 of 73 slices shown]
[im 25/73  brain]
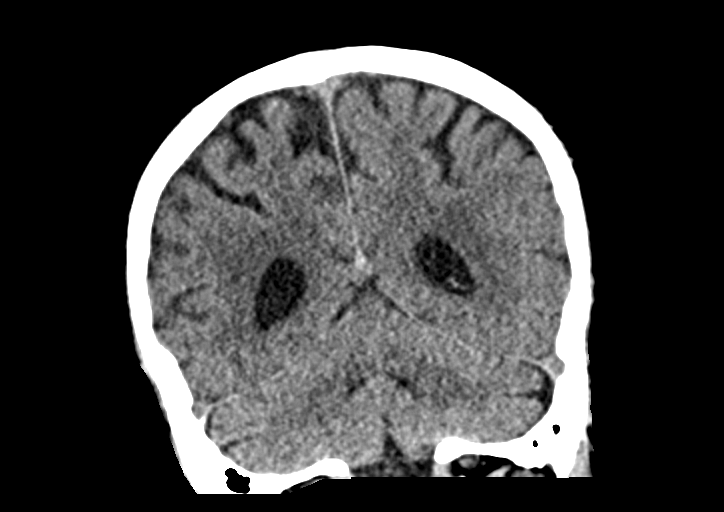
[im 33/73  brain]
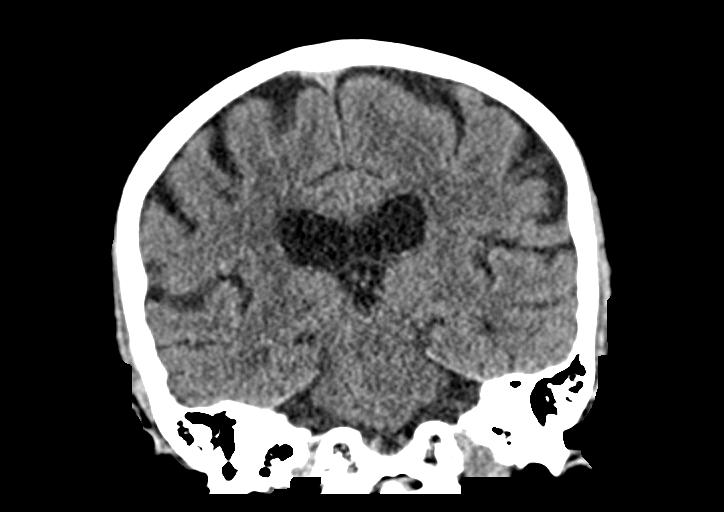
[im 41/73  brain]
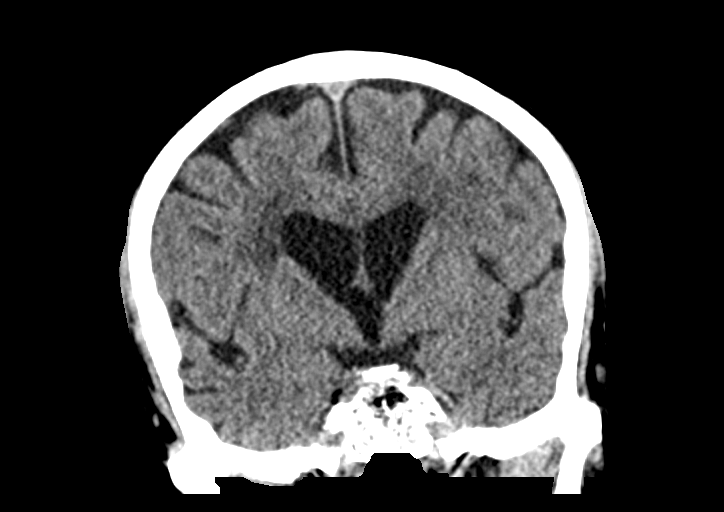

[Series 5: sag soft · sagittal · 0.30mm/px · 3 of 67 slices shown]
[im 23/67  brain]
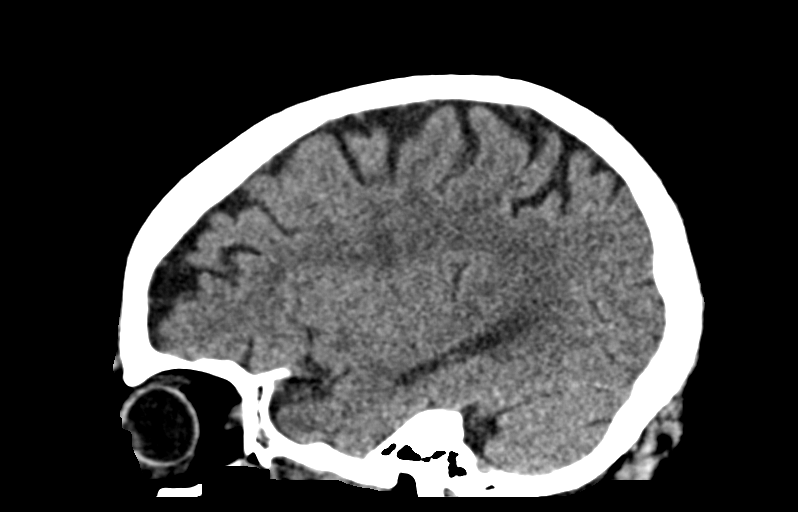
[im 34/67  brain]
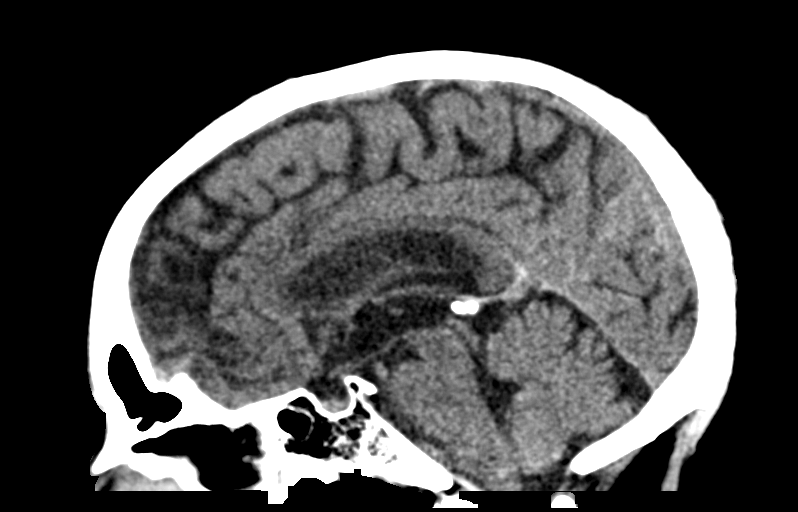
[im 45/67  brain]
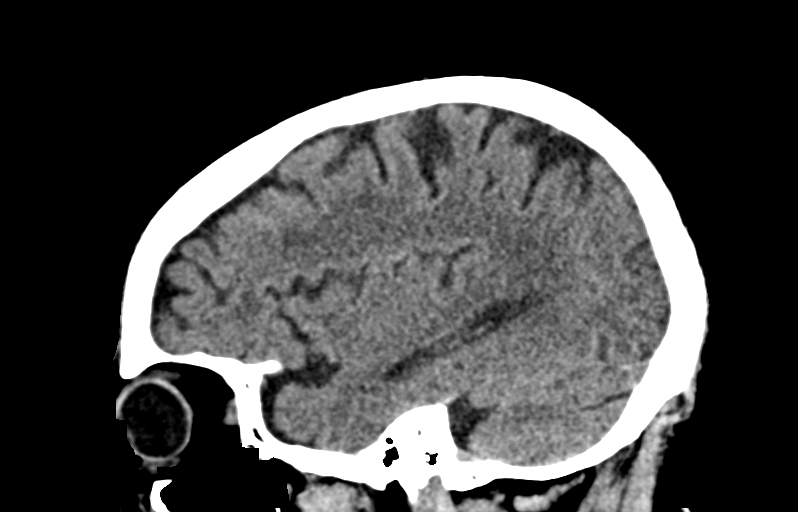

[14 of 47 positions shown; findings below may reference images not displayed]

FINDINGS: Brain: Ventricle size and cerebral volume normal for age.
Hypodensity right anterior basal ganglia unchanged compatible with
chronic infarction. Mild patchy white matter hypodensity bilaterally
with progression since 4000. This appears to be chronic
microvascular ischemia.

Negative for acute infarct, hemorrhage, mass

Vascular: Negative for hyperdense vessel

Skull: Negative

Sinuses/Orbits: Paranasal sinuses clear. Bilateral cataract
extraction

Other: None
IMPRESSION: No acute abnormality. Chronic microvascular ischemic change with
progression since [DATE].

## 2023-01-27 ENCOUNTER — Emergency Department (HOSPITAL_BASED_OUTPATIENT_CLINIC_OR_DEPARTMENT_OTHER)
Admission: EM | Admit: 2023-01-27 | Discharge: 2023-01-27 | Disposition: A | Discharge status: Home / Self Care | Payer: Medicare PPO | Attending: Emergency Medicine | Admitting: Emergency Medicine

## 2023-01-27 ENCOUNTER — Other Ambulatory Visit: Payer: Self-pay

## 2023-01-27 ENCOUNTER — Emergency Department (HOSPITAL_BASED_OUTPATIENT_CLINIC_OR_DEPARTMENT_OTHER): Payer: Medicare PPO

## 2023-01-27 DIAGNOSIS — Z7902 Long term (current) use of antithrombotics/antiplatelets: Secondary | ICD-10-CM | POA: Diagnosis not present

## 2023-01-27 DIAGNOSIS — R079 Chest pain, unspecified: Secondary | ICD-10-CM

## 2023-01-27 DIAGNOSIS — R112 Nausea with vomiting, unspecified: Secondary | ICD-10-CM

## 2023-01-27 DIAGNOSIS — I2510 Atherosclerotic heart disease of native coronary artery without angina pectoris: Secondary | ICD-10-CM | POA: Insufficient documentation

## 2023-01-27 DIAGNOSIS — Z7982 Long term (current) use of aspirin: Secondary | ICD-10-CM | POA: Insufficient documentation

## 2023-01-27 DIAGNOSIS — Z79899 Other long term (current) drug therapy: Secondary | ICD-10-CM | POA: Insufficient documentation

## 2023-01-27 DIAGNOSIS — I10 Essential (primary) hypertension: Secondary | ICD-10-CM | POA: Diagnosis not present

## 2023-01-27 DIAGNOSIS — Z7984 Long term (current) use of oral hypoglycemic drugs: Secondary | ICD-10-CM | POA: Diagnosis not present

## 2023-01-27 DIAGNOSIS — K449 Diaphragmatic hernia without obstruction or gangrene: Secondary | ICD-10-CM | POA: Diagnosis not present

## 2023-01-27 DIAGNOSIS — R109 Unspecified abdominal pain: Secondary | ICD-10-CM | POA: Diagnosis present

## 2023-01-27 DIAGNOSIS — E119 Type 2 diabetes mellitus without complications: Secondary | ICD-10-CM | POA: Diagnosis not present

## 2023-01-27 LAB — URINALYSIS, ROUTINE W REFLEX MICROSCOPIC
Bilirubin Urine: NEGATIVE
Glucose, UA: NEGATIVE mg/dL
Hgb urine dipstick: NEGATIVE
Ketones, ur: NEGATIVE mg/dL
Leukocytes,Ua: NEGATIVE
Nitrite: NEGATIVE
Protein, ur: 30 mg/dL — AB
Specific Gravity, Urine: 1.015 (ref 1.005–1.030)
pH: 5.5 (ref 5.0–8.0)

## 2023-01-27 LAB — COMPREHENSIVE METABOLIC PANEL
ALT: 15 U/L (ref 0–44)
AST: 25 U/L (ref 15–41)
Albumin: 3.9 g/dL (ref 3.5–5.0)
Alkaline Phosphatase: 85 U/L (ref 38–126)
Anion gap: 12 (ref 5–15)
BUN: 37 mg/dL — ABNORMAL HIGH (ref 8–23)
CO2: 19 mmol/L — ABNORMAL LOW (ref 22–32)
Calcium: 7.7 mg/dL — ABNORMAL LOW (ref 8.9–10.3)
Chloride: 105 mmol/L (ref 98–111)
Creatinine, Ser: 2.37 mg/dL — ABNORMAL HIGH (ref 0.61–1.24)
GFR, Estimated: 26 mL/min — ABNORMAL LOW (ref >60)
Glucose, Bld: 91 mg/dL (ref 70–99)
Potassium: 3.9 mmol/L (ref 3.5–5.1)
Sodium: 136 mmol/L (ref 135–145)
Total Bilirubin: 0.5 mg/dL (ref 0.3–1.2)
Total Protein: 7.9 g/dL (ref 6.5–8.1)

## 2023-01-27 LAB — CBC
HCT: 36.8 % — ABNORMAL LOW (ref 39.0–52.0)
Hemoglobin: 12.1 g/dL — ABNORMAL LOW (ref 13.0–17.0)
MCH: 26.4 pg (ref 26.0–34.0)
MCHC: 32.9 g/dL (ref 30.0–36.0)
MCV: 80.2 fL (ref 80.0–100.0)
Platelets: 169 10*3/uL (ref 150–400)
RBC: 4.59 MIL/uL (ref 4.22–5.81)
RDW: 15.9 % — ABNORMAL HIGH (ref 11.5–15.5)
WBC: 8.8 10*3/uL (ref 4.0–10.5)
nRBC: 0 % (ref 0.0–0.2)

## 2023-01-27 LAB — URINALYSIS, MICROSCOPIC (REFLEX)

## 2023-01-27 LAB — LIPASE, BLOOD: Lipase: 41 U/L (ref 11–51)

## 2023-01-27 LAB — TROPONIN I (HIGH SENSITIVITY)
Troponin I (High Sensitivity): 7 ng/L (ref <18)
Troponin I (High Sensitivity): 6 ng/L (ref <18)

## 2023-01-27 MED ORDER — FAMOTIDINE IN NACL 20-0.9 MG/50ML-% IV SOLN
20.0000 mg | Freq: Once | INTRAVENOUS | Status: AC
  Administered 2023-01-27: 20 mg via INTRAVENOUS
  Filled 2023-01-27: qty 50

## 2023-01-27 MED ORDER — ONDANSETRON HCL 4 MG/2ML IJ SOLN
4.0000 mg | Freq: Once | INTRAMUSCULAR | Status: AC
  Administered 2023-01-27: 4 mg via INTRAVENOUS
  Filled 2023-01-27: qty 2

## 2023-01-27 MED ORDER — SODIUM CHLORIDE 0.9 % IV BOLUS
500.0000 mL | Freq: Once | INTRAVENOUS | Status: AC
  Administered 2023-01-27: 500 mL via INTRAVENOUS

## 2023-01-27 NOTE — ED Notes (Signed)
D/c paperwork reviewed with pt, including follow up care.  All questions and/or concerns addressed at time of d/c.  No further needs expressed. . Pt verbalized understanding, Ambulatory without assistance to ED exit, NAD.   

## 2023-01-27 NOTE — ED Provider Notes (Signed)
[facility] [place] Provider Note   CSN: [id number] Arrival date & time: 01/27/23  1039     History  Chief Complaint  Patient presents with   Abdominal Pain   Emesis   Chest Pain    Juan Boyd is a 89 y.o. male.  He is here for evaluation of chest and abdominal symptoms.  He said he has had some left-sided chest pain that is been going on for the last 4 days.  Does not seem to be provoked by exertion does not radiate.  He said he gets short of breath with any type of exertion and he seems like he has less energy.  He also has had vomiting with most every meal.  Nonbloody.  He does not really have any abdominal pain but he said he feels very distended after he eats and then vomits.  No diarrhea or constipation.  Has tried nothing for his symptoms.  Prior history of cardiac disease and stents but has not seen his cardiologist in years.  No fevers or chills.  The history is provided by the patient and the spouse.  Emesis Severity:  Moderate Duration:  4 days Timing:  Intermittent Quality:  Stomach contents Progression:  Unchanged Chronicity:  New Relieved by:  Nothing Worsened by:  Nothing Ineffective treatments:  None tried Associated symptoms: cough   Associated symptoms: no abdominal pain, no diarrhea and no fever   Chest Pain Pain location:  L chest Pain quality: aching   Pain severity:  Moderate Onset quality:  Gradual Duration:  4 days Timing:  Intermittent Progression:  Unchanged Chronicity:  New Relieved by:  None tried Worsened by:  Nothing Ineffective treatments:  None tried Associated symptoms: cough, fatigue, nausea and shortness of breath   Associated symptoms: no abdominal pain, no fever and no vomiting   Risk factors: coronary artery disease, diabetes mellitus, high cholesterol, hypertension and male sex        Home Medications Prior to Admission medications   Medication Sig Start Date End Date Taking? Authorizing  Provider  amLODipine (NORVASC) 2.5 MG tablet Take 2.5 mg by mouth daily. 07/01/19   [provider]  aspirin EC 81 MG tablet Take by mouth.    [provider]  carvedilol (COREG) 6.25 MG tablet Take 6.25 mg by mouth 2 (two) times daily with a meal.    [provider]  clopidogrel (PLAVIX) 75 MG tablet TAKE ONE TABLET BY MOUTH ONCE DAILY 12/09/15   [provider]  colchicine 0.6 MG tablet Take 1 tablet (0.6 mg total) by mouth daily. 04/11/15   Sherwood Gambler, MD  EUTHYROX 50 MCG tablet Take 50 mcg by mouth at bedtime. 09/03/20   [provider]  furosemide (LASIX) 20 MG tablet Take 1 tablet (20 mg total) by mouth daily. 06/17/15   Blanchie Dessert, MD  GARLIC PO Take 1 tablet by mouth daily.    [provider]  glipiZIDE (GLUCOTROL XL) 5 MG 24 hr tablet Take 5 mg by mouth daily.    [provider]  HYDROcodone-acetaminophen [facility]) 5-325 MG tablet Take 1/2-1 pill daily prn pain 04/07/22   Aundra Dubin, PA-C  JANUVIA 50 MG tablet Take 50 mg by mouth daily. 09/04/20   [provider]  Omega-3 Fatty Acids (FISH OIL PO) Take 1 capsule by mouth daily.    [provider]  ondansetron (ZOFRAN ODT) 8 MG disintegrating tablet 8mg  ODT q4 hours prn nausea 08/08/19   Delo,  Nathaneil Canary, MD  potassium chloride SA (K-DUR,KLOR-CON) 20 MEQ tablet Take 20 mEq by mouth 2 (two) times daily.    [provider]  pravastatin (PRAVACHOL) 40 MG tablet Take 40 mg by mouth daily.    [provider]  rosuvastatin (CRESTOR) 40 MG tablet Take 40 mg by mouth daily. 08/02/20   [provider]  traMADol (ULTRAM) 50 MG tablet Take 1 tablet (50 mg total) by mouth 3 (three) times daily as needed. 12/09/21   Aundra Dubin, PA-C  lisinopril (PRINIVIL,ZESTRIL) 20 MG tablet Take 20 mg by mouth daily.  12/11/20  [provider]      Allergies    Esomeprazole magnesium and Oxycodone    Review of Systems   Review of Systems   Constitutional:  Positive for fatigue. Negative for fever.  Respiratory:  Positive for cough and shortness of breath.   Cardiovascular:  Positive for chest pain and leg swelling.  Gastrointestinal:  Positive for nausea. Negative for abdominal pain, diarrhea and vomiting.    Physical Exam Updated Vital Signs BP (!) 141/76   Pulse 67   Temp 98.1 F (36.7 C)   Resp 16   Ht 5\' 3"  (1.6 m)   Wt 76.2 kg   SpO2 100%   BMI 29.76 kg/m  Physical Exam Vitals and nursing note reviewed.  Constitutional:      General: He is not in acute distress.    Appearance: Normal appearance. He is well-developed.  HENT:     Head: Normocephalic and atraumatic.  Eyes:     Conjunctiva/sclera: Conjunctivae normal.  Cardiovascular:     Rate and Rhythm: Normal rate and regular rhythm.     Heart sounds: No murmur heard. Pulmonary:     Effort: Pulmonary effort is normal. No respiratory distress.     Breath sounds: Normal breath sounds.  Abdominal:     Palpations: Abdomen is soft.     Tenderness: There is no abdominal tenderness. There is no guarding or rebound.  Musculoskeletal:        General: No deformity.     Cervical back: Neck supple.     Right lower leg: No edema.     Left lower leg: No edema.  Skin:    General: Skin is warm and dry.     Capillary Refill: Capillary refill takes less than 2 seconds.  Neurological:     General: No focal deficit present.     Mental Status: He is alert.     ED Results / Procedures / Treatments   Labs (all labs ordered are listed, but only abnormal results are displayed) Labs Reviewed  CBC - Abnormal; Notable for the following components:      Result Value   Hemoglobin 12.1 (*)    HCT 36.8 (*)    RDW 15.9 (*)    All other components within normal limits  COMPREHENSIVE METABOLIC PANEL - Abnormal; Notable for the following components:   CO2 19 (*)    BUN 37 (*)    Creatinine, Ser 2.37 (*)    Calcium 7.7 (*)    GFR, Estimated 26 (*)    All other  components within normal limits  URINALYSIS, ROUTINE W REFLEX MICROSCOPIC - Abnormal; Notable for the following components:   Protein, ur 30 (*)    All other components within normal limits  URINALYSIS, MICROSCOPIC (REFLEX) - Abnormal; Notable for the following components:   Bacteria, UA RARE (*)    All other components within normal limits  LIPASE,  BLOOD  TROPONIN I (HIGH SENSITIVITY)  TROPONIN I (HIGH SENSITIVITY)    EKG EKG Interpretation  Date/Time:  Wednesday January 27 2023 10:47:34 EST Ventricular Rate:  66 PR Interval:  183 QRS Duration: 84 QT Interval:  423 QTC Calculation: 444 R Axis:   46 Text Interpretation: Sinus rhythm Borderline T abnormalities, inferior leads No significant change since prior 8/20 Confirmed by Meridee Score [phone]) on 01/27/2023 10:49:52 AM  Radiology CT ABDOMEN PELVIS WO CONTRAST  Result Date: 01/27/2023 CLINICAL DATA:  Lambert Mody acute LEFT side abdominal pain, shortness of breath, generalized abdominal pain, nausea, and vomiting for 2 days rated at 7/10 EXAM: CT ABDOMEN AND PELVIS WITHOUT CONTRAST TECHNIQUE: Multidetector CT imaging of the abdomen and pelvis was performed following the standard protocol without IV contrast. Patient drank dilute oral contrast for exam. RADIATION DOSE REDUCTION: This exam was performed according to the departmental dose-optimization program which includes automated exposure control, adjustment of the mA and/or kV according to patient size and/or use of iterative reconstruction technique. COMPARISON:  None Available. FINDINGS: Lower chest: Bibasilar atelectasis and subpleural interstitial changes Hepatobiliary: Gallbladder and liver normal appearance Pancreas: Normal appearance Spleen: Normal appearance Adrenals/Urinary Tract: Adrenal glands, LEFT kidney, and ureters normal appearance. No urinary tract calcification or dilatation. Simple appearing cyst RIGHT kidney 3.1 x 2.7 cm image 25; no follow-up imaging recommended.  Bladder unremarkable for degree of distension. Stomach/Bowel: Normal appendix. Diverticulosis of distal descending and sigmoid colon without evidence of diverticulitis. Moderate-sized hiatal hernia. Remaining stomach and bowel loops unremarkable. Vascular/Lymphatic: Atherosclerotic calcifications aorta without aneurysm. No adenopathy. Reproductive: Unremarkable prostate gland and seminal vesicles Other: No free air or free fluid. No hernia or inflammatory process. Musculoskeletal: Osseous demineralization with degenerative changes at SI joints. Facet degenerative changes lower lumbar spine. IMPRESSION: Distal colonic diverticulosis without evidence of diverticulitis. Moderate-sized hiatal hernia. No acute intra-abdominal or intrapelvic abnormalities. Aortic Atherosclerosis (ICD10-I70.0). Electronically Signed   By: Ulyses Southward M.D.   On: 01/27/2023 14:36   DG Chest 2 View  Result Date: 01/27/2023 CLINICAL DATA:  Chest pain EXAM: CHEST - 2 VIEW COMPARISON:  05/08/2020 FINDINGS: Cardiomegaly. Both lungs are clear. The visualized skeletal structures are unremarkable. IMPRESSION: Cardiomegaly without acute abnormality of the lungs. Electronically Signed   By: Jearld Lesch M.D.   On: 01/27/2023 11:24    Procedures Procedures    Medications Ordered in ED Medications  ondansetron (ZOFRAN) injection 4 mg (4 mg Intravenous Given 01/27/23 1203)  famotidine (PEPCID) IVPB 20 mg premix (0 mg Intravenous Stopped 01/27/23 1251)  sodium chloride 0.9 % bolus 500 mL (0 mLs Intravenous Stopped 01/27/23 1341)    ED Course/ Medical Decision Making/ A&P Clinical Course as of 01/27/23 1657  Wed Jan 27, 2023  1111 Chest x-ray interpreted by me as no definite infiltrate.  Awaiting radiology reading. [MB]    Clinical Course User Index [MB] Terrilee Files, MD                             Medical Decision Making Amount and/or Complexity of Data Reviewed Labs: ordered. Radiology: ordered.  Risk Prescription drug  management.   This patient complains of left-sided chest pain, nausea and vomiting this involves an extensive number of treatment Options and is a complaint that carries with it a high risk of complications and morbidity. The differential includes ACS, pneumonia Aurax, PE, gastritis, peptic ulcer disease, obstruction;   I ordered, reviewed and interpreted labs, which included CBC with normal  white count, hemoglobin slightly low similar to priors, chemistries with chronic CKD mildly low bicarb, LFTs normal, urinalysis without signs of infection, troponin flat I ordered medication IV fluids Pepcid Zofran and reviewed PMP when indicated. I ordered imaging studies which included chest x-ray and CT abdomen and pelvis and I independently    visualized and interpreted imaging which showed moderate hiatal hernia Additional history obtained from patient's wife Previous records obtained and reviewed in epic no recent admissions Cardiac monitoring reviewed, normal sinus rhythm Social determinants considered, no significant barriers Critical Interventions: None  After the interventions stated above, I reevaluated the patient and found patient to be resting comfortably no distress.  He is hungry asking for food Admission and further testing considered, no indications for admission or further workup at this time.  He is already working on getting a follow-up appointment with cardiology.  Will place referral for GI and try to maximize his gastritis regiment.  Return instructions discussed         Final Clinical Impression(s) / ED Diagnoses Final diagnoses:  Nausea and vomiting, unspecified vomiting type  Nonspecific chest pain  Hiatal hernia    Rx / DC Orders ED Discharge Orders          Ordered    Ambulatory referral to Gastroenterology        01/27/23 1446              Hayden Rasmussen, MD 01/27/23 1700

## 2023-01-27 NOTE — ED Triage Notes (Addendum)
Pt c/o intermittent, sharp L side chest pain, SOB, generalized abdominal pain, and n/v x2 days.  Pain score  7/10.  Pt had a cardiac stent placed in 2017.

## 2023-01-27 NOTE — Discharge Instructions (Signed)
You were seen in the emergency department for evaluation of nausea vomiting and some left-sided chest pain.  You had blood work EKG and a CAT scan of your abdomen.  You have evidence of a hiatal hernia and this may be the cause of your nausea vomiting.  Please increase your omeprazole to 40 mg daily.  Cut up food smaller and chew well.  We have put a referral in for you to follow-up with GI.  You were also working on getting a follow-up appointment with your cardiologist.  Continue your regular medications.  Return to the emergency department if any worsening or concerning symptoms

## 2024-08-03 NOTE — Progress Notes (Signed)
 SUBJECTIVE   Patient ID: Juan Boyd is a 89 y.o. (DOB [birth year]) male   Chief Complaint  Patient presents with  . Subclinical hypothydroidism    F/U 10/26/24 & MWV 04/26/25   This patient is accompanied in the office by his grand-daughter.    Hypothyroidism: Patient presents today for follow-up and medical management of hypothyroidism. - There were changes in medical management at the last office visit. - Continued same does but take 1/2 tablet one day a week.  - But he misunderstood and is taking 6 days a week and nothing on Sunday. -He repeatedly reassured us  that he was taking his medications on an empty stomach every day and without any other medications/minerals/vitamins - Patient is taking medication(s) as prescribed. - Current regimen: levothyroxine  88 mcg daily 6 days a week.  - Patient has symptoms of hypothyroidism: (more fatigue and cold intolerance) - Levothyroxine  was last dispensed # 90 on 02/09/24 according to medication adherence in chart.   Lab Results  Component Value Date   TSH 60.439 (H) 08/02/2024       Results for orders placed or performed in visit on 08/02/24  Renal Function Panel   Collection Time: 08/02/24  8:42 AM  Result Value Ref Range   Sodium 136 136 - 145 mmol/L   Potassium 4.9 3.5 - 5.1 mmol/L   Chloride 102 98 - 107 mmol/L   CO2 25 21 - 31 mmol/L   Anion Gap 9 6 - 14 mmol/L   Glucose, Random 90 70 - 99 mg/dL   Blood Urea Nitrogen (BUN) 23 7 - 25 mg/dL   Creatinine 7.01 (H) 9.29 - 1.30 mg/dL   eGFR 20 (L) >40 fO/fpw/8.26f7   Albumin 4.6 3.5 - 5.7 g/dL   Calcium  8.0 (L) 8.6 - 10.3 mg/dL   Phosphorus 4.3 2.5 - 5.0 mg/dL   BUN/Creatinine Ratio 7.7 (L) 10.0 - 20.0  TSH   Collection Time: 08/02/24  8:42 AM  Result Value Ref Range   TSH 60.439 (H) 0.450 - 5.330 uIU/mL  T4, Free   Collection Time: 08/02/24  8:42 AM  Result Value Ref Range   T4, Free <0.4 (L) 0.6 - 1.1 ng/dL     OBJECTIVE   Joozmhpzd[8]  Current  Medications[2]  Problem List[3]  Medical History[4]  Social History[5]  Surgical History[6]  Family History[7]  Review of Systems Systemic:  Not feeling tired or poorly. No fever or no chills. Head:  No headache. Eyes:  No vision problems. Otolaryngeal:  No earache and no sore throat. Cardiovascular:  No chest pain or discomfort and no palpitations. Pulmonary:  No dyspnea and no cough. Gastrointestinal:  No vomiting, no abdominal pain, and no melena. Genitourinary:  No dysuria. Musculoskeletal:  No localized joint pain and no localized joint swelling. Neurological:  No motor disturbances and no sensory disturbances. Psychological:  No anxiety and no depression. Skin:  No skin lesions or rash.  BP 138/74 (BP Location: Right arm, Patient Position: Sitting)   Pulse 61   Wt 78.2 kg (172 lb 6.4 oz)   SpO2 99%   BMI 30.06 kg/m   Physical Exam Constitutional: Oriented to person, place and time. Appears well-developed and well nourished. HEENT: Normocephalic, atraumatic.   Skin: Warm and dry. No rashes noted. Patient is not diaphoretic. Extremities: No edema bilaterally. No clubbing/cyanosis. Neuro:  CN grossly intact Psychiatric: Normal mood and affect. Behavior normal. Judgement and thought content normal.   ASSESSMENT/PLAN   Problem List Items Addressed This Visit  Subclinical hypothyroidism - Primary   Relevant Medications   levothyroxine  (SYNTHROID ) 88 mcg tablet    Patient Instructions  1. Subclinical hypothyroidism (Primary) Not controlled. ? Missing doses.  His grand-daughter agrees to make sure he is taking medication daily on an empty stomach. Take 1 tablet daily and recheck in 3 months as already scheduled.  - levothyroxine  (SYNTHROID ) 88 mcg tablet; Take 1 tablet (88 mcg total) by mouth in the morning.  Dispense: 90 tablet; Refill: 0   Return in 12 weeks (on 10/26/2024) for Chronic follow up/Labs prior (20 min), as already scheduled.  No orders of  the defined types were placed in this encounter.   Requested Prescriptions   Signed Prescriptions Disp Refills  . levothyroxine  (SYNTHROID ) 88 mcg tablet 90 tablet 0    Sig: Take 1 tablet (88 mcg total) by mouth in the morning.    Risks, benefits, and alternatives of the medication(s) and treatment plan(s) were discussed, and he expressed understanding. Plan follow up as discussed or as needed. No barriers to treatment identified in this visit.    This document serves as a record of services personally performed by Ozell DOROTHA Lenis, MD. It was created on his behalf by Delon Moats Motsinger, CMA, a trained Medical Scribe. The creation of this record is the provider's dictation and/or activities during the visit.   Portions of this note were dictated using DRAGON voice recognition software. Please disregard any errors in transcription.         [1] Allergies Allergen Reactions  . Esomeprazole Magnesium Angioedema  . Oxycodone  GI Intolerance  [2] Current Outpatient Medications  Medication Sig Dispense Refill  . albuterol HFA (PROVENTIL HFA;VENTOLIN HFA;PROAIR HFA) 90 mcg/actuation inhaler Inhale 2 puffs every 6 (six) hours as needed for wheezing. 1 Inhaler 1  . amLODIPine  (NORVASC ) 5 mg tablet Take 1 tablet (5 mg total) by mouth daily. 90 tablet 1  . aspirin  81 mg EC tablet Take 81 mg by mouth Once Daily.    . calcium  carbonate (OS-CAL) 1500 mg (600 mg calcium ) tablet Take 1 tablet by mouth 2 (two) times a day.    . carvediloL  (COREG ) 25 mg tablet Take 1 tablet (25 mg total) by mouth in the morning and 1 tablet (25 mg total) in the evening. Take with meals. 180 tablet 1  . cholecalciferol (VITAMIN D3) 10 mcg (400 unit) tablet Take 1 tablet by mouth Once Daily.    . colchicine  0.6 mg tablet Take 2 tablets upon onset, then take 1 tablet 2 hours later as needed for gout 12 tablet 1  . cyanocobalamin (VITAMIN B12) 2,500 mcg tab Take 1 tablet by mouth Once Daily.    . ezetimibe   (ZETIA ) 10 mg tablet Take 1 tablet (10 mg total) by mouth daily. 90 tablet 3  . famotidine  (PEPCID ) 20 mg tablet Take 1 tablet (20 mg total) by mouth 2 (two) times a day. 180 tablet 3  . nitroglycerin (NITROSTAT) 0.4 mg SL tablet Place 1 tablet (0.4 mg total) under the tongue every 5 (five) minutes as needed for chest pain. 25 tablet 1  . omega 3-dha-epa-fish oil (OMEGA 3) 1,000 mg capsule Take 1 g by mouth Once Daily.    . potassium chloride (KLOR-CON) 10 mEq ER tablet Take 1 tablet by mouth once daily 90 tablet 1  . rosuvastatin  (CRESTOR ) 10 mg tablet Take 1 tablet (10 mg total) by mouth daily. 90 tablet 1  . SITagliptin phosphate (Januvia) 25 mg tablet Take 1 tablet (25 mg  total) by mouth daily. 90 tablet 1  . levothyroxine  (SYNTHROID ) 88 mcg tablet Take 1 tablet (88 mcg total) by mouth in the morning. 90 tablet 0   No current facility-administered medications for this visit.  [3] Patient Active Problem List Diagnosis  . History of CVA (cerebrovascular accident) without residual deficits  . Anemia  . Benign hypertension with chronic kidney disease, stage IV (HCC)  . Bilateral lower extremity edema  . Pseudophakia of both eyes  . CAD in native artery  . Calcifying tendinitis of shoulder  . Type 2 diabetes mellitus with stage 4 chronic kidney disease, without long-term current use of insulin  (HCC)  . DM (diabetes mellitus), type 2 with ophthalmic complications (HCC)  . Disease of pancreas  . Gastroesophageal reflux disease  . Hyperlipidemia  . Nonproliferative diabetic retinopathy of both eyes (HCC)  . Osteopenia  . Primary osteoarthritis of right knee  . Chronic right shoulder pain  . Gout due to renal impairment  . Complete tear of right rotator cuff  . Osteoarthritis of right glenohumeral joint  . Long term current use of oral hypoglycemic drug  . Epiretinal membrane (ERM) of left eye  . Presbyopia of both eyes  . Vitreous floater, bilateral  . Dermatochalasis of both upper  eyelids  . Thrombocytopenia (HCC)  . Mild cognitive impairment  . Sensorineural hearing loss (SNHL) of both ears  . Tinnitus of both ears  . Restrictive lung disease  . History of 2019 novel coronavirus disease (COVID-19)  . Dietary B12 deficiency  . Subclinical hypothyroidism  . Ingrowing nail, right great toe  . Onychomycosis due to dermatophyte  . Chronic kidney disease, stage 4 (severe)    (CMD)  . Hypocalcemia  . Hypokalemia  . Hypomagnesemia  [4] Past Medical History: Diagnosis Date  . Anemia   . CKD (chronic kidney disease)   . Dermatochalasis of both upper eyelids   . Diabetes mellitus    (CMD)   . Epiretinal membrane (ERM) of left eye   . GERD (gastroesophageal reflux disease)   . Gout   . Gout   . High cholesterol   . Hypercholesterolemia   . Hypertension   . Mild nonproliferative diabetic retinopathy of both eyes without macular edema associated with type 2 diabetes mellitus    (CMD)   . Osteopenia   . PCO (posterior capsular opacification), right   . Presbyopia of both eyes   . Pseudophakia of both eyes   . Thrombocytopenia   . Thyroid disease   . Vitreous floater, bilateral   [5] Social History Tobacco Use  . Smoking status: Never    Passive exposure: Never  . Smokeless tobacco: Never  Substance Use Topics  . Alcohol use: Not Currently  . Drug use: Never  [6] Past Surgical History: Procedure Laterality Date  . CATARACT EXTRACTION Bilateral 04/2015   Procedure: CATARACT EXTRACTION  . COLONOSCOPY     Procedure: COLONOSCOPY  . HYDROCELE EXCISION / REPAIR     Procedure: HYDROCELE EXCISION / REPAIR  . KNEE ARTHROSCOPY W/ ACL RECONSTRUCTION     Procedure: KNEE ARTHROSCOPY ACL RECONSTRUCTION  [7] Family History Problem Relation Name Age of Onset  . Hypertension Mother    . Heart disease Mother    . Diabetes Daughter    . Macular degeneration Neg Hx    . Glaucoma Neg Hx    . Cancer Neg Hx    . Hyperlipidemia Neg Hx    . Aortic aneurysm Neg Hx     .  Prostate cancer Neg Hx    . Colon cancer Neg Hx    . Osteoporosis Neg Hx

## 2024-08-22 NOTE — Progress Notes (Signed)
 [facility] Outpatient Return Visit      Assessment/Plan:   1. Chronic kidney disease, stage 4 (severe)    (CMD)      2. Benign hypertension with chronic kidney disease, stage IV (HCC)      3. Type 2 diabetes mellitus with stage 4 chronic kidney disease, without long-term current use of insulin  (HCC)      4. Bilateral lower extremity edema        CKD stage IV -Risk factors include age, hypertension, diabetes. Baseline creatinine was 2.1-2.3 since 2021 but this year has been worse at 2.5-2.7.  Off ACE inhibitor.  Last UPCR was only mildly elevated at 262. Renal ultrasound showed small kidneys bilaterally with increased echogenicity.  -Creatinine is worse at 2.9 EGFR 20 this visit -Denies recent illness or NSAID use -Electrolytes notable for consistent hypocalcemia despite taking calcium  supplement twice daily now -Advised to supplement calcium  daily in his diet -Blood pressure is elevated today but improved on recheck.  Monitors at home and reports good readings.  Continue -He does have significant lower extremity edema today.  Possibly due to uncontrolled thyroid function.  Mistakenly stopped his thyroid medication.  Has since restarted. -I did offer option of starting a fluid pill but he wants to defer it for now -Diabetes well-controlled with A1c of 6.7  Return in about 5 months (around 01/22/2025).  No orders of the defined types were placed in this encounter.   Maxene Shilling MD  [facility] 08/22/2024 3:35 PM    Interval History:    History of Present Illness:  Juan Boyd returns for follow-up of CKD stage IV Past medical history of hypertension, diabetes, proteinuria  He is accompanied by his grandson today Baseline creatinine 2.5-2.7  This visit creatinine is worse at 2.9 EGFR 20 with calcium  of 8.0 He recently had severe abnormal thyroid function with TSH of 60. PCP saw patient and discussed medication compliance. Patient says he had mistakenly  stopped taking it and was taking cholesterol medicine but he thought it was his thyroid medicine.  He has since restarted.  This was about 3 weeks ago He does have lower extremity edema which was noted last visit for the first time.  He seems to have more this time.  Denies any orthopnea, PND or shortness of breath on exertion He has only had some Grits today Blood pressure is elevated today.  He does monitor at home and reports readings are consistently below 140 systolic.  He did take his medications today Diabetes is well-controlled with A1c of 6.7  He does take calcium  supplement for hypocalcemia and is taking it twice daily now since we recommended last visit.  Calcium  is still low   Past pertinent history H/o COVID in 2020 where he was admitted twice to the hospital and was critically sick and required brief ICU stay CAD s/p PCI and stent at [facility] within the last ?7 years.  Review of Systems A complete ROS was performed with pertinent positives/negatives noted in the HPI. The remainder of the ROS are negative.   Past History:   Active Ambulatory Problems    Diagnosis Date Noted  . History of CVA (cerebrovascular accident) without residual deficits 10/10/2018  . Anemia 01/17/2016  . Benign hypertension with chronic kidney disease, stage IV (HCC) 01/17/2016  . Bilateral lower extremity edema 01/17/2016  . Pseudophakia of both eyes 01/17/2016  . CAD in native artery 01/17/2016  . Calcifying tendinitis of shoulder 01/17/2016  . Type 2 diabetes mellitus with  stage 4 chronic kidney disease, without long-term current use of insulin  (HCC) 01/17/2016  . DM (diabetes mellitus), type 2 with ophthalmic complications (HCC) 01/17/2016  . Disease of pancreas 01/17/2016  . Gastroesophageal reflux disease 01/17/2016  . Hyperlipidemia 01/17/2016  . Nonproliferative diabetic retinopathy of both eyes (HCC) 01/17/2016  . Osteopenia 01/17/2016  . Primary osteoarthritis of right knee 01/17/2016  .  Chronic right shoulder pain 01/17/2016  . Gout due to renal impairment 02/16/2017  . Complete tear of right rotator cuff 03/25/2017  . Osteoarthritis of right glenohumeral joint 03/25/2017  . Long term current use of oral hypoglycemic drug 11/29/2017  . Epiretinal membrane (ERM) of left eye 11/29/2017  . Presbyopia of both eyes 11/29/2017  . Vitreous floater, bilateral 11/29/2017  . Dermatochalasis of both upper eyelids 11/29/2017  . Thrombocytopenia (HCC) 01/31/2018  . Mild cognitive impairment 10/10/2018  . Sensorineural hearing loss (SNHL) of both ears 12/06/2018  . Tinnitus of both ears 12/06/2018  . Restrictive lung disease 12/28/2019  . History of 2019 novel coronavirus disease (COVID-19) 12/28/2019  . Dietary B12 deficiency 02/29/2020  . Subclinical hypothyroidism 03/07/2020  . Ingrowing nail, right great toe 12/10/2021  . Onychomycosis due to dermatophyte 12/10/2021  . Chronic kidney disease, stage 4 (severe)    (CMD) 05/27/2022  . Hypocalcemia 09/02/2022  . Hypokalemia 04/26/2023  . Hypomagnesemia 10/26/2023   Resolved Ambulatory Problems    Diagnosis Date Noted  . No Resolved Ambulatory Problems   Past Medical History:  Diagnosis Date  . CKD (chronic kidney disease)   . Diabetes mellitus    (CMD)   . GERD (gastroesophageal reflux disease)   . Gout   . Gout   . High cholesterol   . Hypercholesterolemia   . Hypertension   . Mild nonproliferative diabetic retinopathy of both eyes without macular edema associated with type 2 diabetes mellitus    (CMD)   . PCO (posterior capsular opacification), right   . Thyroid disease    Family History  Problem Relation Name Age of Onset  . Hypertension Mother    . Heart disease Mother    . Diabetes Daughter    . Macular degeneration Neg Hx    . Glaucoma Neg Hx    . Cancer Neg Hx    . Hyperlipidemia Neg Hx    . Aortic aneurysm Neg Hx    . Prostate cancer Neg Hx    . Colon cancer Neg Hx    . Osteoporosis Neg Hx      Social History Social History   Tobacco Use  . Smoking status: Never    Passive exposure: Never  . Smokeless tobacco: Never  Substance Use Topics  . Alcohol use: Not Currently  . Drug use: Never   Social History   Social History Narrative  . Not on file     Medications:   Meds Ordered in [facility]  Medication Sig Dispense Refill  . albuterol HFA (PROVENTIL HFA;VENTOLIN HFA;PROAIR HFA) 90 mcg/actuation inhaler Inhale 2 puffs every 6 (six) hours as needed for wheezing. 1 Inhaler 1  . amLODIPine  (NORVASC ) 5 mg tablet Take 1 tablet (5 mg total) by mouth daily. 90 tablet 1  . aspirin  81 mg EC tablet Take 81 mg by mouth Once Daily.    . calcium  carbonate (OS-CAL) 1500 mg (600 mg calcium ) tablet Take 1 tablet by mouth 2 (two) times a day.    . carvediloL  (COREG ) 25 mg tablet Take 1 tablet (25 mg total) by mouth in the morning  and 1 tablet (25 mg total) in the evening. Take with meals. 180 tablet 1  . cholecalciferol (VITAMIN D3) 10 mcg (400 unit) tablet Take 1 tablet by mouth Once Daily.    . colchicine  0.6 mg tablet Take 2 tablets upon onset, then take 1 tablet 2 hours later as needed for gout 12 tablet 1  . cyanocobalamin (VITAMIN B12) 2,500 mcg tab Take 1 tablet by mouth Once Daily.    . ezetimibe  (ZETIA ) 10 mg tablet Take 1 tablet (10 mg total) by mouth daily. 90 tablet 3  . famotidine  (PEPCID ) 20 mg tablet Take 1 tablet (20 mg total) by mouth 2 (two) times a day. 180 tablet 3  . levothyroxine  (SYNTHROID ) 88 mcg tablet Take 1 tablet (88 mcg total) by mouth in the morning. 90 tablet 0  . nitroglycerin (NITROSTAT) 0.4 mg SL tablet Place 1 tablet (0.4 mg total) under the tongue every 5 (five) minutes as needed for chest pain. 25 tablet 1  . omega 3-dha-epa-fish oil (OMEGA 3) 1,000 mg capsule Take 1 g by mouth Once Daily.    . potassium chloride (KLOR-CON) 10 mEq ER tablet Take 1 tablet by mouth once daily 90 tablet 1  . rosuvastatin  (CRESTOR ) 10 mg tablet Take 1 tablet (10 mg total)  by mouth daily. 90 tablet 1  . SITagliptin phosphate (Januvia) 25 mg tablet Take 1 tablet (25 mg total) by mouth daily. 90 tablet 1   No current Epic-ordered facility-administered medications on file.      Physical Exam:   Vitals:   08/22/24 1506 08/22/24 1534  BP: 155/65 (!) 145/59  Patient Position: Sitting Sitting  Pulse: 64 58  Weight: 76.2 kg (168 lb)      Wt Readings from Last 3 Encounters:  08/22/24 76.2 kg (168 lb)  08/03/24 78.2 kg (172 lb 6.4 oz)  04/26/24 74.9 kg (165 lb 3.2 oz)      General appearance: alert, appears stated age and cooperative,  Eyes: Sclera anicteric, no conjunctival pallor Nose, Mouth, Throat: Normal oral mucosa,  Neck: No neck masses palpable, No lymphadenopathy Lungs: Auscultation: Coarse to auscultation bilaterally at the bases Heart: Carotid bruit absent, regular rate and rhythm and S1, S2 normal, edema 2-3+ at the ankles bilaterally Abdomen: soft, non tender, no abdominal bruit Musculoskeletal: Gait normal Skin: Skin color, texture, turgor normal. No rashes or lesions Psych: Mood pleasant, Alert and oriented x 3  Results for orders placed or performed in visit on 08/02/24  Renal Function Panel   Collection Time: 08/02/24  8:42 AM  Result Value Ref Range   Sodium 136 136 - 145 mmol/L   Potassium 4.9 3.5 - 5.1 mmol/L   Chloride 102 98 - 107 mmol/L   CO2 25 21 - 31 mmol/L   Anion Gap 9 6 - 14 mmol/L   Glucose, Random 90 70 - 99 mg/dL   Blood Urea Nitrogen (BUN) 23 7 - 25 mg/dL   Creatinine 7.01 (H) 9.29 - 1.30 mg/dL   eGFR 20 (L) >40 fO/fpw/8.26f7   Albumin 4.6 3.5 - 5.7 g/dL   Calcium  8.0 (L) 8.6 - 10.3 mg/dL   Phosphorus 4.3 2.5 - 5.0 mg/dL   BUN/Creatinine Ratio 7.7 (L) 10.0 - 20.0  TSH   Collection Time: 08/02/24  8:42 AM  Result Value Ref Range   TSH 60.439 (H) 0.450 - 5.330 uIU/mL  T4, Free   Collection Time: 08/02/24  8:42 AM  Result Value Ref Range   T4, Free <0.4 (L) 0.6 -  1.1 ng/dL     Portions of this note  were dictated using Animal nutritionist.  It has been reviewed for accuracy, but may contain grammatical and clerical errors.

## 2024-08-24 ENCOUNTER — Emergency Department (HOSPITAL_BASED_OUTPATIENT_CLINIC_OR_DEPARTMENT_OTHER)

## 2024-08-24 ENCOUNTER — Encounter (HOSPITAL_BASED_OUTPATIENT_CLINIC_OR_DEPARTMENT_OTHER): Payer: Self-pay | Admitting: Emergency Medicine

## 2024-08-24 ENCOUNTER — Inpatient Hospital Stay (HOSPITAL_BASED_OUTPATIENT_CLINIC_OR_DEPARTMENT_OTHER)
Admission: EM | Admit: 2024-08-24 | Discharge: 2024-08-28 | DRG: 291 | Disposition: A | Discharge status: Home / Self Care | Attending: Internal Medicine | Admitting: Internal Medicine

## 2024-08-24 ENCOUNTER — Other Ambulatory Visit: Payer: Self-pay

## 2024-08-24 DIAGNOSIS — N179 Acute kidney failure, unspecified: Secondary | ICD-10-CM | POA: Diagnosis present

## 2024-08-24 DIAGNOSIS — Z7902 Long term (current) use of antithrombotics/antiplatelets: Secondary | ICD-10-CM

## 2024-08-24 DIAGNOSIS — I2510 Atherosclerotic heart disease of native coronary artery without angina pectoris: Secondary | ICD-10-CM | POA: Diagnosis present

## 2024-08-24 DIAGNOSIS — Z7984 Long term (current) use of oral hypoglycemic drugs: Secondary | ICD-10-CM

## 2024-08-24 DIAGNOSIS — Z885 Allergy status to narcotic agent status: Secondary | ICD-10-CM

## 2024-08-24 DIAGNOSIS — E1159 Type 2 diabetes mellitus with other circulatory complications: Secondary | ICD-10-CM | POA: Insufficient documentation

## 2024-08-24 DIAGNOSIS — E7800 Pure hypercholesterolemia, unspecified: Secondary | ICD-10-CM | POA: Diagnosis present

## 2024-08-24 DIAGNOSIS — D631 Anemia in chronic kidney disease: Secondary | ICD-10-CM | POA: Diagnosis present

## 2024-08-24 DIAGNOSIS — I5033 Acute on chronic diastolic (congestive) heart failure: Secondary | ICD-10-CM | POA: Diagnosis present

## 2024-08-24 DIAGNOSIS — I130 Hypertensive heart and chronic kidney disease with heart failure and stage 1 through stage 4 chronic kidney disease, or unspecified chronic kidney disease: Principal | ICD-10-CM | POA: Diagnosis present

## 2024-08-24 DIAGNOSIS — D649 Anemia, unspecified: Secondary | ICD-10-CM | POA: Insufficient documentation

## 2024-08-24 DIAGNOSIS — Z79899 Other long term (current) drug therapy: Secondary | ICD-10-CM

## 2024-08-24 DIAGNOSIS — Z955 Presence of coronary angioplasty implant and graft: Secondary | ICD-10-CM

## 2024-08-24 DIAGNOSIS — Z87891 Personal history of nicotine dependence: Secondary | ICD-10-CM

## 2024-08-24 DIAGNOSIS — I10 Essential (primary) hypertension: Secondary | ICD-10-CM | POA: Insufficient documentation

## 2024-08-24 DIAGNOSIS — J9811 Atelectasis: Secondary | ICD-10-CM | POA: Diagnosis present

## 2024-08-24 DIAGNOSIS — R051 Acute cough: Secondary | ICD-10-CM

## 2024-08-24 DIAGNOSIS — R0609 Other forms of dyspnea: Secondary | ICD-10-CM | POA: Diagnosis present

## 2024-08-24 DIAGNOSIS — N184 Chronic kidney disease, stage 4 (severe): Secondary | ICD-10-CM | POA: Diagnosis present

## 2024-08-24 DIAGNOSIS — Z1152 Encounter for screening for COVID-19: Secondary | ICD-10-CM

## 2024-08-24 DIAGNOSIS — M109 Gout, unspecified: Secondary | ICD-10-CM | POA: Diagnosis present

## 2024-08-24 DIAGNOSIS — Z7982 Long term (current) use of aspirin: Secondary | ICD-10-CM

## 2024-08-24 DIAGNOSIS — Z888 Allergy status to other drugs, medicaments and biological substances status: Secondary | ICD-10-CM

## 2024-08-24 DIAGNOSIS — M25562 Pain in left knee: Secondary | ICD-10-CM | POA: Diagnosis present

## 2024-08-24 DIAGNOSIS — R0600 Dyspnea, unspecified: Secondary | ICD-10-CM | POA: Diagnosis not present

## 2024-08-24 DIAGNOSIS — R0602 Shortness of breath: Secondary | ICD-10-CM | POA: Diagnosis present

## 2024-08-24 DIAGNOSIS — E8770 Fluid overload, unspecified: Principal | ICD-10-CM

## 2024-08-24 DIAGNOSIS — R600 Localized edema: Secondary | ICD-10-CM

## 2024-08-24 DIAGNOSIS — E1122 Type 2 diabetes mellitus with diabetic chronic kidney disease: Secondary | ICD-10-CM | POA: Diagnosis present

## 2024-08-24 DIAGNOSIS — E039 Hypothyroidism, unspecified: Secondary | ICD-10-CM | POA: Diagnosis present

## 2024-08-24 LAB — BASIC METABOLIC PANEL WITH GFR
Anion gap: 16 — ABNORMAL HIGH (ref 5–15)
BUN: 25 mg/dL — ABNORMAL HIGH (ref 8–23)
CO2: 21 mmol/L — ABNORMAL LOW (ref 22–32)
Calcium: 7.8 mg/dL — ABNORMAL LOW (ref 8.9–10.3)
Chloride: 99 mmol/L (ref 98–111)
Creatinine, Ser: 2.81 mg/dL — ABNORMAL HIGH (ref 0.61–1.24)
GFR, Estimated: 21 mL/min — ABNORMAL LOW (ref >60)
Glucose, Bld: 139 mg/dL — ABNORMAL HIGH (ref 70–99)
Potassium: 4.3 mmol/L (ref 3.5–5.1)
Sodium: 136 mmol/L (ref 135–145)

## 2024-08-24 LAB — PRO BRAIN NATRIURETIC PEPTIDE: Pro Brain Natriuretic Peptide: 311 pg/mL — ABNORMAL HIGH (ref <300.0)

## 2024-08-24 LAB — LACTIC ACID, PLASMA: Lactic Acid, Venous: 1.3 mmol/L (ref 0.5–1.9)

## 2024-08-24 LAB — RESP PANEL BY RT-PCR (RSV, FLU A&B, COVID)  RVPGX2
Influenza A by PCR: NEGATIVE
Influenza B by PCR: NEGATIVE
Resp Syncytial Virus by PCR: NEGATIVE
SARS Coronavirus 2 by RT PCR: NEGATIVE

## 2024-08-24 LAB — CBC
HCT: 36.2 % — ABNORMAL LOW (ref 39.0–52.0)
Hemoglobin: 11.5 g/dL — ABNORMAL LOW (ref 13.0–17.0)
MCH: 26.6 pg (ref 26.0–34.0)
MCHC: 31.8 g/dL (ref 30.0–36.0)
MCV: 83.8 fL (ref 80.0–100.0)
Platelets: 184 K/uL (ref 150–400)
RBC: 4.32 MIL/uL (ref 4.22–5.81)
RDW: 15.4 % (ref 11.5–15.5)
WBC: 7.6 K/uL (ref 4.0–10.5)
nRBC: 0 % (ref 0.0–0.2)

## 2024-08-24 LAB — TROPONIN T, HIGH SENSITIVITY
Troponin T High Sensitivity: 22 ng/L — ABNORMAL HIGH (ref 0–19)
Troponin T High Sensitivity: 22 ng/L — ABNORMAL HIGH (ref 0–19)

## 2024-08-24 MED ORDER — ORAL CARE MOUTH RINSE: 15.0000 mL | OROMUCOSAL | Status: DC | PRN

## 2024-08-24 NOTE — Plan of Care (Signed)
 Plan of Care Note for accepted transfer   Patient name: Juan Boyd FMW:[id number] DOB: [birth year]  Facility requesting transfer: [facility] ED Requesting Provider: Dr. Ginger Facility course: 89 year old male with history of CKD stage IV, type 2 diabetes, hypothyroidism, hypertension, hyperlipidemia, CAD status post PCI presented with complaints of dyspnea on exertion, orthopnea, cough, and bilateral lower extremity edema x 1 month.  Not hypoxic.  Creatinine 2.8 (baseline around 2.5), troponin 22> 22, lactic acid normal, COVID/influenza/RSV PCR negative, proBNP 311.  Chest x-ray showing cardiac enlargement, mild atelectasis in the lung bases, and no focal consolidation.  ED physician discussed the case with cardiologist Dr. Alvan who requested admission by medicine service for gentle diuresis/watching renal function, echocardiogram, and cardiology will consult.  Plan of care: The patient is accepted for admission to Telemetry unit at [facility].  [facility] will assume care on arrival to accepting facility. Until arrival, care as per EDP. However, [facility] available 24/7 for questions and assistance.`  Check [email or web address] for on-call coverage.  Nursing staff, please call [facility] Admits & Consults System-Wide number under Amion on patient's arrival so appropriate admitting provider can evaluate the pt.

## 2024-08-24 NOTE — ED Notes (Signed)
 Pre HR 67 Pre RR 18 Pre Sat 97%  Patient ambulated on RA around the nurses station with Sats remaining 97% the entire walk.   Post HR 76 Post RR 20 Post Sat 97%

## 2024-08-24 NOTE — ED Notes (Addendum)
 Called [place] [facility] and gave handoff to Trizia

## 2024-08-24 NOTE — ED Provider Notes (Signed)
 [facility] Provider Note   CSN: [id number] Arrival date & time: 08/24/24  1647     Patient presents with: Cough   Juan Boyd is a 89 y.o. male.   The history is provided by the patient, the spouse and medical records. No language interpreter was used.  Cough Cough characteristics:  Productive Sputum characteristics:  Nondescript Severity:  Moderate Onset quality:  Gradual Duration:  3 weeks Timing:  Constant Progression:  Waxing and waning Chronicity:  New Relieved by:  Nothing Worsened by:  Nothing Ineffective treatments:  None tried Associated symptoms: chills, shortness of breath and sinus congestion   Associated symptoms: no chest pain, no diaphoresis, no fever, no headaches, no myalgias, no rash, no rhinorrhea and no wheezing   Shortness of breath:    Severity:  Moderate   Onset quality:  Gradual   Timing:  Intermittent   Progression:  Waxing and waning      Prior to Admission medications   Medication Sig Start Date End Date Taking? Authorizing Provider  amLODipine  (NORVASC ) 2.5 MG tablet Take 2.5 mg by mouth daily. 07/01/19   [provider]  aspirin  EC 81 MG tablet Take by mouth.    [provider]  carvedilol  (COREG ) 6.25 MG tablet Take 6.25 mg by mouth 2 (two) times daily with a meal.    [provider]  clopidogrel  (PLAVIX ) 75 MG tablet TAKE ONE TABLET BY MOUTH ONCE DAILY 12/09/15   [provider]  colchicine  0.6 MG tablet Take 1 tablet (0.6 mg total) by mouth daily. 04/11/15   Freddi Hamilton, MD  EUTHYROX  50 MCG tablet Take 50 mcg by mouth at bedtime. 09/03/20   [provider]  furosemide  (LASIX ) 20 MG tablet Take 1 tablet (20 mg total) by mouth daily. 06/17/15   Doretha Folks, MD  GARLIC PO Take 1 tablet by mouth daily.    [provider]  glipiZIDE (GLUCOTROL XL) 5 MG 24 hr tablet Take 5 mg by mouth daily.    [provider]   HYDROcodone -acetaminophen  (NORCO) 5-325 MG tablet Take 1/2-1 pill daily prn pain 04/07/22   Jule Ronal CROME, PA-C  JANUVIA 50 MG tablet Take 50 mg by mouth daily. 09/04/20   [provider]  Omega-3 Fatty Acids (FISH OIL PO) Take 1 capsule by mouth daily.    [provider]  ondansetron  (ZOFRAN  ODT) 8 MG disintegrating tablet 8mg  ODT q4 hours prn nausea 08/08/19   Geroldine Berg, MD  potassium chloride SA (K-DUR,KLOR-CON) 20 MEQ tablet Take 20 mEq by mouth 2 (two) times daily.    [provider]  pravastatin (PRAVACHOL) 40 MG tablet Take 40 mg by mouth daily.    [provider]  rosuvastatin  (CRESTOR ) 40 MG tablet Take 40 mg by mouth daily. 08/02/20   [provider]  traMADol  (ULTRAM ) 50 MG tablet Take 1 tablet (50 mg total) by mouth 3 (three) times daily as needed. 12/09/21   Jule Ronal CROME, PA-C  lisinopril (PRINIVIL,ZESTRIL) 20 MG tablet Take 20 mg by mouth daily.  12/11/20  [provider]    Allergies: Esomeprazole magnesium and Oxycodone     Review of Systems  Constitutional:  Positive for chills and fatigue. Negative for diaphoresis and fever.  HENT:  Positive for congestion. Negative for rhinorrhea.   Eyes:  Negative for visual disturbance.  Respiratory:  Positive for cough and shortness of breath. Negative for chest tightness and wheezing.   Cardiovascular:  Positive for leg  swelling. Negative for chest pain and palpitations.  Gastrointestinal:  Negative for abdominal pain, constipation, diarrhea, nausea and vomiting.  Genitourinary:  Negative for dysuria and flank pain.  Musculoskeletal:  Negative for back pain, myalgias, neck pain and neck stiffness.  Skin:  Negative for rash and wound.  Neurological:  Negative for headaches.  Psychiatric/Behavioral:  Negative for agitation and confusion.   All other systems reviewed and are negative.   Updated Vital Signs BP (!) 156/68   Pulse 65   Temp 99.1 F (37.3 C)   Resp 15   Ht  5' 3 (1.6 m)   Wt 76.2 kg   SpO2 100%   BMI 29.76 kg/m   Physical Exam Vitals and nursing note reviewed.  Constitutional:      General: He is not in acute distress.    Appearance: He is well-developed. He is not ill-appearing, toxic-appearing or diaphoretic.  HENT:     Head: Normocephalic and atraumatic.     Nose: No congestion or rhinorrhea.     Mouth/Throat:     Mouth: Mucous membranes are moist.  Eyes:     Extraocular Movements: Extraocular movements intact.     Conjunctiva/sclera: Conjunctivae normal.  Cardiovascular:     Rate and Rhythm: Normal rate and regular rhythm.     Heart sounds: No murmur heard. Pulmonary:     Effort: Pulmonary effort is normal. No respiratory distress.     Breath sounds: Rhonchi and rales present. No wheezing.  Chest:     Chest wall: No tenderness.  Abdominal:     General: Abdomen is flat.     Palpations: Abdomen is soft.     Tenderness: There is no abdominal tenderness. There is no right CVA tenderness, left CVA tenderness, guarding or rebound.  Musculoskeletal:        General: No swelling or tenderness.     Cervical back: Neck supple. No tenderness.     Right lower leg: Edema present.     Left lower leg: Edema present.  Skin:    General: Skin is warm and dry.     Capillary Refill: Capillary refill takes less than 2 seconds.     Coloration: Skin is not pale.     Findings: No erythema or rash.  Neurological:     General: No focal deficit present.     Mental Status: He is alert.     Sensory: No sensory deficit.     Motor: No weakness.  Psychiatric:        Mood and Affect: Mood normal.     (all labs ordered are listed, but only abnormal results are displayed) Labs Reviewed  BASIC METABOLIC PANEL WITH GFR - Abnormal; Notable for the following components:      Result Value   CO2 21 (*)    Glucose, Bld 139 (*)    BUN 25 (*)    Creatinine, Ser 2.81 (*)    Calcium  7.8 (*)    GFR, Estimated 21 (*)    Anion gap 16 (*)    All other  components within normal limits  CBC - Abnormal; Notable for the following components:   Hemoglobin 11.5 (*)    HCT 36.2 (*)    All other components within normal limits  PRO BRAIN NATRIURETIC PEPTIDE - Abnormal; Notable for the following components:   Pro Brain Natriuretic Peptide 311.0 (*)    All other components within normal limits  TROPONIN T, HIGH SENSITIVITY - Abnormal; Notable for the following components:  Troponin T High Sensitivity 22 (*)    All other components within normal limits  RESP PANEL BY RT-PCR (RSV, FLU A&B, COVID)  [id number]  CULTURE, BLOOD (ROUTINE X 2)  CULTURE, BLOOD (ROUTINE X 2)  LACTIC ACID, PLASMA  TROPONIN T, HIGH SENSITIVITY    EKG: EKG Interpretation Date/Time:  Thursday August 24 2024 17:02:41 EDT Ventricular Rate:  67 PR Interval:  181 QRS Duration:  85 QT Interval:  383 QTC Calculation: 405 R Axis:   20  Text Interpretation: Sinus rhythm Low voltage, precordial leads Consider anterior infarct Borderline T abnormalities, inferior leads when compared to prior, ew q wave in lead 3 and t wave inversion in lead 3 . No STEMI Confirmed by Ginger Barefoot ([id number]) on 08/24/2024 5:15:11 PM  Radiology: DG Chest 2 View Result Date: 08/24/2024 CLINICAL DATA:  Shortness of breath. Cough for 1 month. Orthopnea and dyspnea on exertion. Bilateral lower extremity swelling. EXAM: CHEST - 2 VIEW COMPARISON:  03/03/2023 FINDINGS: Mild cardiac enlargement. Low lung volumes with atelectasis in the lung bases. No airspace disease or consolidation. No pleural effusion or pneumothorax. Mediastinal contours appear intact. Calcification of the aorta. Degenerative changes in the spine. IMPRESSION: Cardiac enlargement. Mild atelectasis in the lung bases. No focal consolidation. Electronically Signed   By: Elsie Gravely M.D.   On: 08/24/2024 17:38     Procedures   Medications Ordered in the ED - No data to display                                  Medical Decision  Making Amount and/or Complexity of Data Reviewed Labs: ordered. Radiology: ordered.    Juan Boyd is a 89 y.o. male with a past medical history significant for hypertension, hypercholesterolemia, diabetes, CAD with PCI who presents with several weeks of worsening shortness of breath, peripheral edema, cough, fatigue, and some recent chills.  According to patient, has had chills all recently and has been coughing up some phlegm.  He denies any chest pain or palpitations but has the shortness of breath especially with exertion.  Family says he is using more pillows and having more difficulty lying flat although patient denies this.  He has had worsening peripheral edema despite taking his diuretic.  He denies history of blood clots in his legs or lungs.  Denies any pleuritic discomfort.  Denies nausea, vomiting, constipation, diarrhea, or urinary changes.  Denies trauma.  Denies abdominal pain or flank pain or back pain.  On exam, lungs did have some very faint rales and rhonchi.  Chest nontender.  No murmur.  Abdomen nontender.  He has edema in both legs but has intact sensation and strength.  He did have pulses.  Patient resting and is not hypoxic at this time.  He is very warm to the touch and oral temperature was 99, will get a rectal temperature to see if he has fever.  Clinically I am concerned that he could have pneumonia given his productive cough and near fever versus viral illness such as COVID or flu.  Will get nasal swab.  Will get screening labs and given his peripheral edema worsening with the shortness of breath and difficulty lying flat, will get a BNP and with his cardiac history we will also get a troponin.  Will get chest x-ray and labs.  Anticipate reassessment after workup to determine disposition.            Workup  began to return.  Patient's creatinine is worse than prior at 2.81.  Other labs similar to prior.  No lactic acidosis.  He does not have COVID flu RSV.  Troponin is  nearly normal at 22 but proBNP is slightly elevated at 311.  X-ray shows some atelectasis but no consolidation or pneumonia.  His rectal temperature was not elevated but I do suspect possibly some viral illness.  With his kidney function worsening and evidence of clinical fluid overload with some crackles in his lungs, edema in his legs despite him taking diuretics, will call cardiology to discuss a safe plan  7:52 PM Spoke to Dr. Alvan with cardiology and they would like him to admitted to the medicine service at [facility] so he can be safely diuresed while watching his kidney function.  They also feel they can see him in consultation and he can get an echo to make sure there is not some other heart failure worsening causing this edema and worsening shortness of breath.  Patient is not hypoxic at this time but his symptoms are more exertional when he is resting now.  Will call medicine for admission and the recommendation of cardiology.       Final diagnoses:  Hypervolemia, unspecified hypervolemia type  Exertional shortness of breath  Peripheral edema  AKI (acute kidney injury) (HCC)  Acute cough   Clinical Impression: 1. Hypervolemia, unspecified hypervolemia type   2. Exertional shortness of breath   3. Peripheral edema   4. AKI (acute kidney injury) (HCC)   5. Acute cough     Disposition: Admit  This note was prepared with assistance of Dragon voice recognition software. Occasional wrong-word or sound-a-like substitutions may have occurred due to the inherent limitations of voice recognition software.     Brek Reece, Lonni PARAS, MD 08/24/24 [phone]

## 2024-08-24 NOTE — ED Triage Notes (Signed)
 Pt c/o cough x 1 month, orthopnea, dyspnea on exertion. Also reports swelling BLLE swelling for appx same time.

## 2024-08-24 NOTE — Consult Note (Signed)
 Cardiology Consultation   Patient ID: Dijuan Sleeth MRN: [id number]; DOB: 16-Feb-1933  Admit date: 08/24/2024 Date of Consult: 08/25/2024  PCP:  Millicent Sharper, MD   [facility] Providers Cardiologist:  None        Patient Profile: Aedyn Kempfer is a 89 y.o. male with a hx of CKD stage IV, type 2 diabetes, hypothyroidism, HTN, HLD, CAD status post PCI to OM presented with complaints of dyspnea on exertion, orthopnea, cough, and bilateral lower extremity edema x 1 month who is being seen 08/25/2024 for the evaluation of heart failure at the request of medicine.  History of Present Illness: Mr. Crall is an 89 year old male with significant CKD, HTN, HLD, CAD who presents with subacute worsening of dyspnea, orthopnea, swelling.  Typically follows with [facility] for which she had seen cardiology for similar symptoms with lower extremity edema underwent TTE on 07/2023 with normal biventricular function, grade 1 diastolic dysfunction.  Did not appear to have been on a standing diuretic.  Denies chest pain, recent infections.  Presented to outside hospital ED due to persistent worsening symptoms as above.  Noted to be afebrile hemodynamically stable.  No oxygen requirement.  EKG nonischemic, sinus rhythm. creatinine 2.8, troponin 22-22, proBNP 311, normal lactate.  Chest x-ray unremarkable.  Transferred to medicine service for further evaluation.  Of note, TSH on 08/02/2024 was 60 with T4 less than 0.4.  Was on Synthroid  88 mcg.  Charted as subclinical.  Past Medical History:  Diagnosis Date   Diabetes mellitus without complication (HCC)    High cholesterol    Hypertension    Past Surgical History:  Procedure Laterality Date   CORONARY ANGIOPLASTY WITH STENT PLACEMENT     EYE SURGERY      Home Medications:  Prior to Admission medications   Medication Sig Start Date End Date Taking? Authorizing Provider  amLODipine  (NORVASC ) 2.5 MG tablet Take 2.5 mg by mouth daily. 07/01/19   [provider]  aspirin  EC 81 MG tablet Take by mouth.    [provider]  carvedilol  (COREG ) 6.25 MG tablet Take 6.25 mg by mouth 2 (two) times daily with a meal.    [provider]  clopidogrel  (PLAVIX ) 75 MG tablet TAKE ONE TABLET BY MOUTH ONCE DAILY 12/09/15   [provider]  colchicine  0.6 MG tablet Take 1 tablet (0.6 mg total) by mouth daily. 04/11/15   Freddi Hamilton, MD  EUTHYROX  50 MCG tablet Take 50 mcg by mouth at bedtime. 09/03/20   [provider]  furosemide  (LASIX ) 20 MG tablet Take 1 tablet (20 mg total) by mouth daily. 06/17/15   Doretha Folks, MD  GARLIC PO Take 1 tablet by mouth daily.    [provider]  glipiZIDE (GLUCOTROL XL) 5 MG 24 hr tablet Take 5 mg by mouth daily.    [provider]  HYDROcodone -acetaminophen  (NORCO) 5-325 MG tablet Take 1/2-1 pill daily prn pain 04/07/22   Jule Ronal CROME, PA-C  JANUVIA 50 MG tablet Take 50 mg by mouth daily. 09/04/20   [provider]  Omega-3 Fatty Acids (FISH OIL PO) Take 1 capsule by mouth daily.    [provider]  ondansetron  (ZOFRAN  ODT) 8 MG disintegrating tablet 8mg  ODT q4 hours prn nausea 08/08/19   Geroldine Berg, MD  potassium chloride SA (K-DUR,KLOR-CON) 20 MEQ tablet Take 20 mEq by mouth 2 (two) times daily.    [provider]  pravastatin (PRAVACHOL) 40 MG tablet Take 40 mg by mouth daily.  [provider]  rosuvastatin  (CRESTOR ) 40 MG tablet Take 40 mg by mouth daily. 08/02/20   [provider]  traMADol  (ULTRAM ) 50 MG tablet Take 1 tablet (50 mg total) by mouth 3 (three) times daily as needed. 12/09/21   Jule Ronal CROME, PA-C  lisinopril (PRINIVIL,ZESTRIL) 20 MG tablet Take 20 mg by mouth daily.  12/11/20  [provider]    Scheduled Meds:  Continuous Infusions:  PRN Meds: mouth rinse  Allergies:    Allergies  Allergen Reactions   Esomeprazole Magnesium Anxiety   Oxycodone  Nausea Only    Social  History:   Social History   Socioeconomic History   Marital status: Married    Spouse name: Not on file   Number of children: Not on file   Years of education: Not on file   Highest education level: Not on file  Occupational History   Not on file  Tobacco Use   Smoking status: Former   Smokeless tobacco: Never  Vaping Use   Vaping status: Never Used  Substance and Sexual Activity   Alcohol use: No   Drug use: No   Sexual activity: Not on file  Other Topics Concern   Not on file  Social History Narrative   Not on file   Social Drivers of Health   Financial Resource Strain: Not on file  Food Insecurity: No Food Insecurity (08/24/2024)   Hunger Vital Sign    Worried About Running Out of Food in the Last Year: Never true    Ran Out of Food in the Last Year: Never true  Transportation Needs: No Transportation Needs (08/24/2024)   PRAPARE - Transportation    Lack of Transportation (Medical): No    Lack of Transportation (Non-Medical): No  Physical Activity: Not on file  Stress: Not on file  Social Connections: Socially Integrated (08/24/2024)   Social Connection and Isolation Panel    Frequency of Communication with Friends and Family: More than three times a week    Frequency of Social Gatherings with Friends and Family: Once a week    Attends Religious Services: More than 4 times per year    Active Member of [employer] or Organizations: Yes    Attends Banker Meetings: More than 4 times per year    Marital Status: Married  Catering manager Violence: Not At Risk (08/24/2024)   Humiliation, Afraid, Rape, and Kick questionnaire    Fear of Current or Ex-Partner: No    Emotionally Abused: No    Physically Abused: No    Sexually Abused: No    Family History:   History reviewed. No pertinent family history.   ROS:  Please see the history of present illness.   All other ROS reviewed and negative.     Physical Exam/Data: Vitals:   08/24/24 1930 08/24/24 2000  08/24/24 2030 08/24/24 2248  BP: (!) 150/62 (!) 149/73 (!) 143/68 (!) 147/70  Pulse: 65 62 68 65  Resp: 18 17 17 18   Temp:    98.4 F (36.9 C)  TempSrc:    Oral  SpO2: 99% 98% 98% 100%  Weight:    75.7 kg  Height:    5' 3 (1.6 m)    Intake/Output Summary (Last 24 hours) at 08/25/2024 0029 Last data filed at 08/24/2024 2300 Gross per 24 hour  Intake --  Output 1100 ml  Net -1100 ml      08/24/2024   10:48 PM 08/24/2024    4:53 PM 01/27/2023  10:49 AM  Last 3 Weights  Weight (lbs) 166 lb 12.8 oz 168 lb 168 lb  Weight (kg) 75.66 kg 76.204 kg 76.204 kg     Body mass index is 29.55 kg/m.  General:  Well nourished, well developed, in no acute distress HEENT: normal Neck: no JVD Vascular: No carotid bruits; Distal pulses 2+ bilaterally Cardiac:  normal S1, S2; RRR; no murmur  Lungs:  clear to auscultation bilaterally, no wheezing, rhonchi or rales  Abd: soft, nontender, no hepatomegaly  Ext: 1+ edema Musculoskeletal:  No deformities, BUE and BLE strength normal and equal Skin: warm and dry  Neuro:  CNs 2-12 intact, no focal abnormalities noted Psych:  Normal affect   EKG:  The EKG was personally reviewed and demonstrates:  NSR  Telemetry:  Telemetry was personally reviewed and demonstrates:  same  Relevant CV Studies:  TTE 07/2023 SUMMARY  There is mild concentric left ventricular hypertrophy with normal wall motion, normal systolic  function and ejection fraction  60-65% .  There is mild diastolic dysfunction, Grade IA, with normal left atrial pressure.  The right ventricular systolic function is normal.  The left atrial size is normal.  The right atrium is borderline dilated.  There is aortic valve sclerosis.  The mitral valve is normal in structure and function.  There is trace tricuspid regurgitation.  Trace pulmonic valvular regurgitation.  Normal IVC size and collapsibility; Right atrial pressure is estimated to be 3 mm Hg.  There is no pericardial effusion.   No significnat change compared to prior study.   Laboratory Data: High Sensitivity Troponin:  No results for input(s): TROPONINIHS in the last 720 hours.   Chemistry Recent Labs  Lab 08/24/24 1715  NA 136  K 4.3  CL 99  CO2 21*  GLUCOSE 139*  BUN 25*  CREATININE 2.81*  CALCIUM  7.8*  GFRNONAA 21*  ANIONGAP 16*    No results for input(s): PROT, ALBUMIN, AST, ALT, ALKPHOS, BILITOT in the last 168 hours. Lipids No results for input(s): CHOL, TRIG, HDL, LABVLDL, LDLCALC, CHOLHDL in the last 168 hours.  Hematology Recent Labs  Lab 08/24/24 1715  WBC 7.6  RBC 4.32  HGB 11.5*  HCT 36.2*  MCV 83.8  MCH 26.6  MCHC 31.8  RDW 15.4  PLT 184   Thyroid No results for input(s): TSH, FREET4 in the last 168 hours.  BNP Recent Labs  Lab 08/24/24 1728  PROBNP 311.0*    DDimer No results for input(s): DDIMER in the last 168 hours.  Radiology/Studies:  DG Chest 2 View Result Date: 08/24/2024 CLINICAL DATA:  Shortness of breath. Cough for 1 month. Orthopnea and dyspnea on exertion. Bilateral lower extremity swelling. EXAM: CHEST - 2 VIEW COMPARISON:  03/03/2023 FINDINGS: Mild cardiac enlargement. Low lung volumes with atelectasis in the lung bases. No airspace disease or consolidation. No pleural effusion or pneumothorax. Mediastinal contours appear intact. Calcification of the aorta. Degenerative changes in the spine. IMPRESSION: Cardiac enlargement. Mild atelectasis in the lung bases. No focal consolidation. Electronically Signed   By: Elsie Gravely M.D.   On: 08/24/2024 17:38   Assessment and Plan: Dyspnea on exertion, lower extremity edema Suspect patient volume overloaded and worsening dyspnea multifactorial secondary to CKD and possible diastolic dysfunction +/- deconditioning.  Echo 1 year ago with normal biventricular function, proBNP is only mildly elevated, creatinine is near baseline.  Troponin flat, presentation not consistent with acute  coronary syndrome.  Of note his hypothyroidism is not well-controlled and this may be contributing  to a now clinical picture. Exam more peripheral overload than central.  - Repeat TTE - can start lasix  20 mg for volume overload - Iron studies - repeat TSH, fT4 - Continue Synthroid  on empty stomach   Risk Assessment/Risk Scores:       [place]  Heart Association (NYHA) Functional Class NYHA Class III       For questions or updates, please contact [facility] Please consult [email or web address] for contact info under    Signed, Ozell Bushman, MD  08/25/2024 12:29 AM

## 2024-08-25 ENCOUNTER — Observation Stay (HOSPITAL_COMMUNITY)

## 2024-08-25 ENCOUNTER — Encounter (HOSPITAL_COMMUNITY): Payer: Self-pay | Admitting: Internal Medicine

## 2024-08-25 DIAGNOSIS — E7800 Pure hypercholesterolemia, unspecified: Secondary | ICD-10-CM | POA: Diagnosis present

## 2024-08-25 DIAGNOSIS — E039 Hypothyroidism, unspecified: Secondary | ICD-10-CM | POA: Insufficient documentation

## 2024-08-25 DIAGNOSIS — I5030 Unspecified diastolic (congestive) heart failure: Secondary | ICD-10-CM | POA: Diagnosis not present

## 2024-08-25 DIAGNOSIS — D631 Anemia in chronic kidney disease: Secondary | ICD-10-CM | POA: Diagnosis present

## 2024-08-25 DIAGNOSIS — R600 Localized edema: Secondary | ICD-10-CM

## 2024-08-25 DIAGNOSIS — Z7984 Long term (current) use of oral hypoglycemic drugs: Secondary | ICD-10-CM | POA: Diagnosis not present

## 2024-08-25 DIAGNOSIS — E1122 Type 2 diabetes mellitus with diabetic chronic kidney disease: Secondary | ICD-10-CM | POA: Diagnosis present

## 2024-08-25 DIAGNOSIS — R0609 Other forms of dyspnea: Secondary | ICD-10-CM

## 2024-08-25 DIAGNOSIS — Z87891 Personal history of nicotine dependence: Secondary | ICD-10-CM | POA: Diagnosis not present

## 2024-08-25 DIAGNOSIS — Z955 Presence of coronary angioplasty implant and graft: Secondary | ICD-10-CM | POA: Diagnosis not present

## 2024-08-25 DIAGNOSIS — Z7902 Long term (current) use of antithrombotics/antiplatelets: Secondary | ICD-10-CM | POA: Diagnosis not present

## 2024-08-25 DIAGNOSIS — I2510 Atherosclerotic heart disease of native coronary artery without angina pectoris: Secondary | ICD-10-CM | POA: Diagnosis present

## 2024-08-25 DIAGNOSIS — N184 Chronic kidney disease, stage 4 (severe): Secondary | ICD-10-CM | POA: Insufficient documentation

## 2024-08-25 DIAGNOSIS — Z888 Allergy status to other drugs, medicaments and biological substances status: Secondary | ICD-10-CM | POA: Diagnosis not present

## 2024-08-25 DIAGNOSIS — Z885 Allergy status to narcotic agent status: Secondary | ICD-10-CM | POA: Diagnosis not present

## 2024-08-25 DIAGNOSIS — I509 Heart failure, unspecified: Secondary | ICD-10-CM | POA: Diagnosis not present

## 2024-08-25 DIAGNOSIS — Z1152 Encounter for screening for COVID-19: Secondary | ICD-10-CM | POA: Diagnosis not present

## 2024-08-25 DIAGNOSIS — I5033 Acute on chronic diastolic (congestive) heart failure: Secondary | ICD-10-CM | POA: Diagnosis present

## 2024-08-25 DIAGNOSIS — M109 Gout, unspecified: Secondary | ICD-10-CM | POA: Diagnosis present

## 2024-08-25 DIAGNOSIS — J9811 Atelectasis: Secondary | ICD-10-CM | POA: Diagnosis present

## 2024-08-25 DIAGNOSIS — I10 Essential (primary) hypertension: Secondary | ICD-10-CM

## 2024-08-25 DIAGNOSIS — Z7982 Long term (current) use of aspirin: Secondary | ICD-10-CM | POA: Diagnosis not present

## 2024-08-25 DIAGNOSIS — E1159 Type 2 diabetes mellitus with other circulatory complications: Secondary | ICD-10-CM

## 2024-08-25 DIAGNOSIS — I130 Hypertensive heart and chronic kidney disease with heart failure and stage 1 through stage 4 chronic kidney disease, or unspecified chronic kidney disease: Secondary | ICD-10-CM | POA: Diagnosis present

## 2024-08-25 DIAGNOSIS — D649 Anemia, unspecified: Secondary | ICD-10-CM | POA: Insufficient documentation

## 2024-08-25 DIAGNOSIS — Z79899 Other long term (current) drug therapy: Secondary | ICD-10-CM | POA: Diagnosis not present

## 2024-08-25 DIAGNOSIS — M25562 Pain in left knee: Secondary | ICD-10-CM | POA: Diagnosis present

## 2024-08-25 DIAGNOSIS — R0602 Shortness of breath: Secondary | ICD-10-CM | POA: Diagnosis present

## 2024-08-25 DIAGNOSIS — N179 Acute kidney failure, unspecified: Secondary | ICD-10-CM | POA: Diagnosis present

## 2024-08-25 LAB — BLOOD CULTURE ID PANEL (REFLEXED) - BCID2

## 2024-08-25 LAB — LIPID PANEL
Cholesterol: 172 mg/dL (ref 0–200)
HDL: 36 mg/dL — ABNORMAL LOW (ref >40)
LDL Cholesterol: 109 mg/dL — ABNORMAL HIGH (ref 0–99)
Total CHOL/HDL Ratio: 4.8 ratio
Triglycerides: 133 mg/dL (ref <150)
VLDL: 27 mg/dL (ref 0–40)

## 2024-08-25 LAB — CBC WITH DIFFERENTIAL/PLATELET
Abs Immature Granulocytes: 0.03 K/uL (ref 0.00–0.07)
Basophils Absolute: 0 K/uL (ref 0.0–0.1)
Basophils Relative: 1 %
Eosinophils Absolute: 0.2 K/uL (ref 0.0–0.5)
Eosinophils Relative: 2 %
HCT: 37.5 % — ABNORMAL LOW (ref 39.0–52.0)
Hemoglobin: 12.1 g/dL — ABNORMAL LOW (ref 13.0–17.0)
Immature Granulocytes: 0 %
Lymphocytes Relative: 29 %
Lymphs Abs: 2 K/uL (ref 0.7–4.0)
MCH: 27.3 pg (ref 26.0–34.0)
MCHC: 32.3 g/dL (ref 30.0–36.0)
MCV: 84.5 fL (ref 80.0–100.0)
Monocytes Absolute: 0.8 K/uL (ref 0.1–1.0)
Monocytes Relative: 11 %
Neutro Abs: 3.9 K/uL (ref 1.7–7.7)
Neutrophils Relative %: 57 %
Platelets: 183 K/uL (ref 150–400)
RBC: 4.44 MIL/uL (ref 4.22–5.81)
RDW: 15.4 % (ref 11.5–15.5)
WBC: 6.9 K/uL (ref 4.0–10.5)
nRBC: 0 % (ref 0.0–0.2)

## 2024-08-25 LAB — HEPATIC FUNCTION PANEL
ALT: 12 U/L (ref 0–44)
AST: 19 U/L (ref 15–41)
Albumin: 3.5 g/dL (ref 3.5–5.0)
Alkaline Phosphatase: 69 U/L (ref 38–126)
Bilirubin, Direct: <0.1 mg/dL (ref 0.0–0.2)
Total Bilirubin: 0.6 mg/dL (ref 0.0–1.2)
Total Protein: 6.8 g/dL (ref 6.5–8.1)

## 2024-08-25 LAB — BASIC METABOLIC PANEL WITH GFR
Anion gap: 12 (ref 5–15)
Anion gap: 21 — ABNORMAL HIGH (ref 5–15)
BUN: 24 mg/dL — ABNORMAL HIGH (ref 8–23)
BUN: 29 mg/dL — ABNORMAL HIGH (ref 8–23)
CO2: 24 mmol/L (ref 22–32)
CO2: 14 mmol/L — ABNORMAL LOW (ref 22–32)
Calcium: 8.1 mg/dL — ABNORMAL LOW (ref 8.9–10.3)
Calcium: 8.3 mg/dL — ABNORMAL LOW (ref 8.9–10.3)
Chloride: 103 mmol/L (ref 98–111)
Chloride: 103 mmol/L (ref 98–111)
Creatinine, Ser: 2.73 mg/dL — ABNORMAL HIGH (ref 0.61–1.24)
Creatinine, Ser: 2.81 mg/dL — ABNORMAL HIGH (ref 0.61–1.24)
GFR, Estimated: 22 mL/min — ABNORMAL LOW (ref >60)
GFR, Estimated: 21 mL/min — ABNORMAL LOW (ref >60)
Glucose, Bld: 135 mg/dL — ABNORMAL HIGH (ref 70–99)
Glucose, Bld: 81 mg/dL (ref 70–99)
Potassium: 4 mmol/L (ref 3.5–5.1)
Potassium: 4.5 mmol/L (ref 3.5–5.1)
Sodium: 139 mmol/L (ref 135–145)
Sodium: 138 mmol/L (ref 135–145)

## 2024-08-25 LAB — ECHOCARDIOGRAM COMPLETE
Area-P 1/2: 3.42 cm2
Height: 63 in
S' Lateral: 2.3 cm
Weight: 2668.8 [oz_av]

## 2024-08-25 LAB — MAGNESIUM: Magnesium: 1.8 mg/dL (ref 1.7–2.4)

## 2024-08-25 LAB — HEMOGLOBIN A1C
Hgb A1c MFr Bld: 7 % — ABNORMAL HIGH (ref 4.8–5.6)
Mean Plasma Glucose: 154 mg/dL

## 2024-08-25 LAB — POTASSIUM: Potassium: 3.7 mmol/L (ref 3.5–5.1)

## 2024-08-25 LAB — GLUCOSE, CAPILLARY
Glucose-Capillary: 150 mg/dL — ABNORMAL HIGH (ref 70–99)
Glucose-Capillary: 179 mg/dL — ABNORMAL HIGH (ref 70–99)
Glucose-Capillary: 114 mg/dL — ABNORMAL HIGH (ref 70–99)
Glucose-Capillary: 153 mg/dL — ABNORMAL HIGH (ref 70–99)

## 2024-08-25 LAB — TSH: TSH: 3.24 u[IU]/mL (ref 0.350–4.500)

## 2024-08-25 LAB — CK: Total CK: 432 U/L — ABNORMAL HIGH (ref 49–397)

## 2024-08-25 MED ORDER — ATORVASTATIN CALCIUM 40 MG PO TABS
40.0000 mg | ORAL_TABLET | Freq: Every day | ORAL | Status: DC
  Administered 2024-08-26 – 2024-08-28 (×3): 40 mg via ORAL
  Filled 2024-08-25 (×3): qty 1

## 2024-08-25 MED ORDER — GUAIFENESIN-DM 100-10 MG/5ML PO SYRP
5.0000 mL | ORAL_SOLUTION | ORAL | Status: DC | PRN
  Administered 2024-08-25: 5 mL via ORAL
  Filled 2024-08-25 (×2): qty 5

## 2024-08-25 MED ORDER — FUROSEMIDE 10 MG/ML IJ SOLN
20.0000 mg | Freq: Once | INTRAMUSCULAR | Status: AC
  Administered 2024-08-25: 20 mg via INTRAVENOUS
  Filled 2024-08-25: qty 2

## 2024-08-25 MED ORDER — FUROSEMIDE 10 MG/ML IJ SOLN
40.0000 mg | Freq: Two times a day (BID) | INTRAMUSCULAR | Status: DC
  Administered 2024-08-25 – 2024-08-26 (×3): 40 mg via INTRAVENOUS
  Filled 2024-08-25 (×3): qty 4

## 2024-08-25 MED ORDER — FUROSEMIDE 10 MG/ML IJ SOLN: 40.0000 mg | Freq: Two times a day (BID) | INTRAMUSCULAR | Status: DC

## 2024-08-25 MED ORDER — OMEGA-3-ACID ETHYL ESTERS 1 G PO CAPS
1.0000 g | ORAL_CAPSULE | Freq: Every day | ORAL | Status: DC
  Administered 2024-08-25 – 2024-08-28 (×4): 1 g via ORAL
  Filled 2024-08-25 (×4): qty 1

## 2024-08-25 MED ORDER — HEPARIN SODIUM (PORCINE) 5000 UNIT/ML IJ SOLN
5000.0000 [IU] | Freq: Three times a day (TID) | INTRAMUSCULAR | Status: DC
  Administered 2024-08-25 – 2024-08-27 (×9): 5000 [IU] via SUBCUTANEOUS
  Filled 2024-08-25 (×9): qty 1

## 2024-08-25 MED ORDER — HYDROMORPHONE HCL 1 MG/ML IJ SOLN: 1.0000 mg | INTRAMUSCULAR | Status: DC | PRN

## 2024-08-25 MED ORDER — ROSUVASTATIN CALCIUM 5 MG PO TABS
10.0000 mg | ORAL_TABLET | Freq: Every day | ORAL | Status: DC
  Administered 2024-08-25: 10 mg via ORAL
  Filled 2024-08-25: qty 2

## 2024-08-25 MED ORDER — ASPIRIN 81 MG PO TBEC
81.0000 mg | DELAYED_RELEASE_TABLET | Freq: Every day | ORAL | Status: DC
  Administered 2024-08-25 – 2024-08-28 (×4): 81 mg via ORAL
  Filled 2024-08-25 (×4): qty 1

## 2024-08-25 MED ORDER — ACETAMINOPHEN 650 MG RE SUPP: 650.0000 mg | Freq: Four times a day (QID) | RECTAL | Status: DC | PRN

## 2024-08-25 MED ORDER — ROSUVASTATIN CALCIUM 20 MG PO TABS: 40.0000 mg | ORAL_TABLET | Freq: Every day | ORAL | Status: DC

## 2024-08-25 MED ORDER — LEVOTHYROXINE SODIUM 88 MCG PO TABS: 88.0000 ug | ORAL_TABLET | Freq: Every day | ORAL | Status: DC

## 2024-08-25 MED ORDER — ATORVASTATIN CALCIUM 80 MG PO TABS: 80.0000 mg | ORAL_TABLET | Freq: Every day | ORAL | Status: DC

## 2024-08-25 MED ORDER — LEVOTHYROXINE SODIUM 88 MCG PO TABS
88.0000 ug | ORAL_TABLET | Freq: Every day | ORAL | Status: DC
  Administered 2024-08-26 – 2024-08-28 (×3): 88 ug via ORAL
  Filled 2024-08-25 (×3): qty 1

## 2024-08-25 MED ORDER — CLOPIDOGREL BISULFATE 75 MG PO TABS: 75.0000 mg | ORAL_TABLET | Freq: Every day | ORAL | Status: DC

## 2024-08-25 MED ORDER — ACETAMINOPHEN 325 MG PO TABS
650.0000 mg | ORAL_TABLET | Freq: Four times a day (QID) | ORAL | Status: DC | PRN
  Administered 2024-08-26: 650 mg via ORAL
  Filled 2024-08-25: qty 2

## 2024-08-25 MED ORDER — FAMOTIDINE 20 MG PO TABS
10.0000 mg | ORAL_TABLET | Freq: Every day | ORAL | Status: DC
  Administered 2024-08-25 – 2024-08-28 (×4): 10 mg via ORAL
  Filled 2024-08-25 (×4): qty 1

## 2024-08-25 MED ORDER — AMLODIPINE BESYLATE 5 MG PO TABS
5.0000 mg | ORAL_TABLET | Freq: Every day | ORAL | Status: DC
  Administered 2024-08-25 – 2024-08-28 (×4): 5 mg via ORAL
  Filled 2024-08-25 (×4): qty 1

## 2024-08-25 MED ORDER — INSULIN ASPART 100 UNIT/ML IJ SOLN
0.0000 [IU] | Freq: Three times a day (TID) | INTRAMUSCULAR | Status: DC
  Administered 2024-08-25: 1 [IU] via SUBCUTANEOUS

## 2024-08-25 MED ORDER — OMEGA-3-ACID ETHYL ESTERS 1 G PO CAPS: 1.0000 g | ORAL_CAPSULE | Freq: Every day | ORAL | Status: DC

## 2024-08-25 MED ORDER — EZETIMIBE 10 MG PO TABS
10.0000 mg | ORAL_TABLET | Freq: Every day | ORAL | Status: DC
  Administered 2024-08-25 – 2024-08-28 (×4): 10 mg via ORAL
  Filled 2024-08-25 (×4): qty 1

## 2024-08-25 MED ORDER — CARVEDILOL 25 MG PO TABS
25.0000 mg | ORAL_TABLET | Freq: Two times a day (BID) | ORAL | Status: DC
  Administered 2024-08-25 – 2024-08-28 (×7): 25 mg via ORAL
  Filled 2024-08-25 (×7): qty 1

## 2024-08-25 MED ORDER — METHOCARBAMOL 500 MG PO TABS
500.0000 mg | ORAL_TABLET | Freq: Three times a day (TID) | ORAL | Status: DC | PRN
  Administered 2024-08-25 – 2024-08-28 (×4): 500 mg via ORAL
  Filled 2024-08-25 (×4): qty 1

## 2024-08-25 NOTE — Plan of Care (Signed)
  Problem: Clinical Measurements: Goal: Respiratory complications will improve Outcome: Progressing Goal: Cardiovascular complication will be avoided Outcome: Progressing   Problem: Activity: Goal: Risk for activity intolerance will decrease Outcome: Progressing   Problem: Safety: Goal: Ability to remain free from injury will improve Outcome: Progressing   Problem: Activity: Goal: Capacity to carry out activities will improve Outcome: Progressing   Problem: Cardiac: Goal: Ability to achieve and maintain adequate cardiopulmonary perfusion will improve Outcome: Progressing

## 2024-08-25 NOTE — Progress Notes (Addendum)
 Progress Note  Patient Name: Juan Boyd Date of Encounter: 08/25/2024 [facility] Cardiologist: None   Interval Summary   Reports doing well this morning  Says he had good urine response with IV Lasix  yesterday  Still with LE edema this morning Reports no chest pain or shortness of breath  Vital Signs Vitals:   08/24/24 2030 08/24/24 2248 08/25/24 0432 08/25/24 0902  BP: (!) 143/68 (!) 147/70 (!) 148/65 (!) 168/72  Pulse: 68 65 64 (!) 58  Resp: 17 18 18 18   Temp:  98.4 F (36.9 C) 98.1 F (36.7 C)   TempSrc:  Oral Oral   SpO2: 98% 100% 100% 100%  Weight:  75.7 kg    Height:  5' 3 (1.6 m)     Intake/Output Summary (Last 24 hours) at 08/25/2024 0906 Last data filed at 08/25/2024 0631 Gross per 24 hour  Intake 100 ml  Output 2350 ml  Net -2250 ml      08/24/2024   10:48 PM 08/24/2024    4:53 PM 01/27/2023   10:49 AM  Last 3 Weights  Weight (lbs) 166 lb 12.8 oz 168 lb 168 lb  Weight (kg) 75.66 kg 76.204 kg 76.204 kg     Telemetry/ECG  Sinus rhythm, HR 70s - Personally Reviewed  Physical Exam  GEN: No acute distress.   Neck: No JVD Cardiac: RRR, no murmurs, rubs, or gallops.  Respiratory: Clear to auscultation bilaterally. GI: Soft, nontender, non-distended  MS: 2+ LE edema  Assessment & Plan   Dyspnea or exertion LE edema Presented to ED with cough, DOE, LE edema Echo from 07/2023 showed EF 60-65%, mild diastolic dysfunction, normal RV function, no significant valvular abnormalities  ProBNP only mildly elevated CXR showed cardiomegaly, mild atelectasis  Received IV Lasix  20 mg x 1 dose  Out 2.3 L yesterday  No longer have DOE but remains with LE edema  Appears to be more peripherally overloaded Pending updated echocardiogram Currently on carvedilol  25 mg BID Pending results of echo, can adjust medications/add diuresis   CAD s/p DES to OM No chest pain Troponin 22 ? 22  Continue ASA 81 mg + Plavix  75 mg daily Continue carvedilol  25 mg  BID Continue Crestor  10 mg daily   Per primary CKD stage 4 Hypertension Diabetes Hypothyroidism Anemia   For questions or updates, please contact [facility] Please consult [email or web address] for contact info under       Signed, [name], PA-C   Patient seen and examined, note reviewed with the signed Advanced Practice Provider. I personally reviewed laboratory data, imaging studies and relevant notes. I independently examined the patient and formulated the important aspects of the plan. I have personally discussed the plan with the patient and/or family. Comments or changes to the note/plan are indicated below.   Patient seen and examined at his bedside. His daughter and grandson were both at the bedside. He offer no specific complaints.   GEN:  Well nourished, well developed in no acute distress HEENT: Mucous membranes moist, good dentition NECK: No JVD; No carotid bruits LYMPHATICS: No lymphadenopathy CARDIAC: S1S2 noted, RRR, no murmurs, rubs, gallops RESPIRATORY:  Clear to auscultation without rales, wheezing or rhonchi  ABDOMEN:  non-tender, non-distended, bowel sounds noted, no guarding EXTREMITIES: +2 bilateral lower extremity edema, no cyanosis, no cyanosis, no clubbing MUSCULOSKELETAL: No deformity  SKIN: Warm and dry NEUROLOGIC:  Alert and oriented x 3, nonfocal PSYCHIATRIC:  Normal affect, good insight  Acute on chronic diastolic heart failure Accelerated  hypertension Coronary artery disease status post PCI to the OM also Chronic kidney disease Hypertension Hyperlipidemia Diabetes mellitus Hypothyroidism  Clinically he appears to be volume overloaded.  He was given Lasix  20 mg x 1 dose and has responded pretty well.  He still does have some volume clinically.  I think he can benefit from Lasix .  Lasix  40 mg twice daily will be started for now and we will closely watch his kidney function in 2 days continue to rise then we will make decision on  adjusting his Lasix .  I reviewed his echocardiogram official read pending EF normal no significant valvular abnormalities.  No reports of anginal symptoms.  Continue his Crestor  as well as his aspirin  and Plavix .  Strict input and output and daily weights  Dub Huntsman DO, MS [facility] Attending Cardiologist [facility]  [address] [place], [place] [address] [phone] Website: [email or web address]

## 2024-08-25 NOTE — H&P (Signed)
 History and Physical    Juan Boyd FMW:[id number] DOB: 08-02-1933 DOA: 08/24/2024  Patient coming from: Home.  Chief Complaint: Lower extremity edema.  HPI: Juan Boyd is a 89 y.o. male with history of CAD status post PCI, chronic kidney disease stage IV, diabetes mellitus type 2, hypothyroidism, hypertension presents to the ER because of shortness of breath and lower extremity edema.  Patient has been having the symptoms for almost a month.  Had followed up with her nephrologist recently at [facility] and was considering Lasix  but has not started yet.  Also was concerned that patient had not taken his Synthroid  for last few weeks due to confusion with the tablets.  Denies chest pain has been and some nonproductive cough.  Also has some orthopnea.  2D echo done in August 2024 showed normal biventricular function with grade 1 diastolic dysfunction.  ED Course: In the ER chest x-ray was unremarkable.  On exam patient has mild edema of both lower extremities.  Troponin is 22 and proBNP is 3100 hemoglobin 11.5 COVID and flu test negative.  Review of Systems: As per HPI, rest all negative.   Past Medical History:  Diagnosis Date   Diabetes mellitus without complication (HCC)    High cholesterol    Hypertension     Past Surgical History:  Procedure Laterality Date   CORONARY ANGIOPLASTY WITH STENT PLACEMENT     EYE SURGERY       reports that he has quit smoking. He has never used smokeless tobacco. He reports that he does not drink alcohol and does not use drugs.  Allergies  Allergen Reactions   Esomeprazole Magnesium Anxiety   Oxycodone  Nausea Only    History reviewed. No pertinent family history.  Prior to Admission medications   Medication Sig Start Date End Date Taking? Authorizing Provider  amLODipine  (NORVASC ) 2.5 MG tablet Take 2.5 mg by mouth daily. 07/01/19   [provider]  aspirin  EC 81 MG tablet Take by mouth.    [provider]  carvedilol  (COREG )  6.25 MG tablet Take 6.25 mg by mouth 2 (two) times daily with a meal.    [provider]  clopidogrel  (PLAVIX ) 75 MG tablet TAKE ONE TABLET BY MOUTH ONCE DAILY 12/09/15   [provider]  colchicine  0.6 MG tablet Take 1 tablet (0.6 mg total) by mouth daily. 04/11/15   Freddi Hamilton, MD  EUTHYROX  50 MCG tablet Take 50 mcg by mouth at bedtime. 09/03/20   [provider]  furosemide  (LASIX ) 20 MG tablet Take 1 tablet (20 mg total) by mouth daily. 06/17/15   Doretha Folks, MD  GARLIC PO Take 1 tablet by mouth daily.    [provider]  glipiZIDE (GLUCOTROL XL) 5 MG 24 hr tablet Take 5 mg by mouth daily.    [provider]  HYDROcodone -acetaminophen  (NORCO) 5-325 MG tablet Take 1/2-1 pill daily prn pain 04/07/22   Jule Ronal CROME, PA-C  JANUVIA 50 MG tablet Take 50 mg by mouth daily. 09/04/20   [provider]  Omega-3 Fatty Acids (FISH OIL PO) Take 1 capsule by mouth daily.    [provider]  ondansetron  (ZOFRAN  ODT) 8 MG disintegrating tablet 8mg  ODT q4 hours prn nausea 08/08/19   Geroldine Berg, MD  potassium chloride SA (K-DUR,KLOR-CON) 20 MEQ tablet Take 20 mEq by mouth 2 (two) times daily.    [provider]  pravastatin (PRAVACHOL) 40 MG tablet Take 40 mg by mouth daily.    [provider]  rosuvastatin  (CRESTOR ) 40 MG tablet Take 40 mg by mouth daily. 08/02/20   [provider]  traMADol  (ULTRAM ) 50 MG tablet Take 1 tablet (50 mg total) by mouth 3 (three) times daily as needed. 12/09/21   Jule Ronal CROME, PA-C  lisinopril (PRINIVIL,ZESTRIL) 20 MG tablet Take 20 mg by mouth daily.  12/11/20  [provider]    Physical Exam: Constitutional: Moderately built and nourished. Vitals:   08/24/24 1930 08/24/24 2000 08/24/24 2030 08/24/24 2248  BP: (!) 150/62 (!) 149/73 (!) 143/68 (!) 147/70  Pulse: 65 62 68 65  Resp: 18 17 17 18   Temp:    98.4 F (36.9 C)  TempSrc:    Oral  SpO2: 99% 98% 98% 100%   Weight:    75.7 kg  Height:    5' 3 (1.6 m)   Eyes: Anicteric no pallor. ENMT: No discharge from the ears eyes nose or mouth. Neck: No mass felt.  No neck rigidity. Respiratory: No rhonchi or crepitations. Cardiovascular: S1 S2 heard. Abdomen: Soft nontender bowel sound present. Musculoskeletal: Mild edema of the both lower extremities. Skin: No rash. Neurologic: Alert awake oriented time place and person.  Moves all extremities. Psychiatric: Appears normal.  Normal affect.   Labs on Admission: I have personally reviewed following labs and imaging studies  CBC: Recent Labs  Lab 08/24/24 1715  WBC 7.6  HGB 11.5*  HCT 36.2*  MCV 83.8  PLT 184   Basic Metabolic Panel: Recent Labs  Lab 08/24/24 1715  NA 136  K 4.3  CL 99  CO2 21*  GLUCOSE 139*  BUN 25*  CREATININE 2.81*  CALCIUM  7.8*   GFR: Estimated Creatinine Clearance: 16.6 mL/min (A) (by C-G formula based on SCr of 2.81 mg/dL (H)). Liver Function Tests: No results for input(s): AST, ALT, ALKPHOS, BILITOT, PROT, ALBUMIN in the last 168 hours. No results for input(s): LIPASE, AMYLASE in the last 168 hours. No results for input(s): AMMONIA in the last 168 hours. Coagulation Profile: No results for input(s): INR, PROTIME in the last 168 hours. Cardiac Enzymes: No results for input(s): CKTOTAL, CKMB, CKMBINDEX, TROPONINI in the last 168 hours. BNP (last 3 results) Recent Labs    08/24/24 1728  PROBNP 311.0*   HbA1C: No results for input(s): HGBA1C in the last 72 hours. CBG: No results for input(s): GLUCAP in the last 168 hours. Lipid Profile: No results for input(s): CHOL, HDL, LDLCALC, TRIG, CHOLHDL, LDLDIRECT in the last 72 hours. Thyroid Function Tests: No results for input(s): TSH, T4TOTAL, FREET4, T3FREE, THYROIDAB in the last 72 hours. Anemia Panel: No results for input(s): VITAMINB12, FOLATE, FERRITIN, TIBC, IRON, RETICCTPCT in  the last 72 hours. Urine analysis:    Component Value Date/Time   COLORURINE YELLOW 01/27/2023 1201   APPEARANCEUR CLEAR 01/27/2023 1201   LABSPEC 1.015 01/27/2023 1201   PHURINE 5.5 01/27/2023 1201   GLUCOSEU NEGATIVE 01/27/2023 1201   HGBUR NEGATIVE 01/27/2023 1201   BILIRUBINUR NEGATIVE 01/27/2023 1201   KETONESUR NEGATIVE 01/27/2023 1201   PROTEINUR 30 (A) 01/27/2023 1201   NITRITE NEGATIVE 01/27/2023 1201   LEUKOCYTESUR NEGATIVE 01/27/2023 1201   Sepsis Labs: @LABRCNTIP (procalcitonin:4,lacticidven:4) ) Recent Results (from the past 240 hours)  Resp panel by RT-PCR (RSV, Flu A&B, Covid) Anterior Nasal Swab     Status: None   Collection Time: 08/24/24  5:28 PM   Specimen: Anterior Nasal Swab  Result Value Ref Range Status   SARS Coronavirus 2 by RT PCR NEGATIVE NEGATIVE Final    Comment: (  NOTE) SARS-CoV-2 target nucleic acids are NOT DETECTED.  The SARS-CoV-2 RNA is generally detectable in upper respiratory specimens during the acute phase of infection. The lowest concentration of SARS-CoV-2 viral copies this assay can detect is 138 copies/mL. A negative result does not preclude SARS-Cov-2 infection and should not be used as the sole basis for treatment or other patient management decisions. A negative result may occur with  improper specimen collection/handling, submission of specimen other than nasopharyngeal swab, presence of viral mutation(s) within the areas targeted by this assay, and inadequate number of viral copies(<138 copies/mL). A negative result must be combined with clinical observations, patient history, and epidemiological information. The expected result is Negative.  Fact Sheet for Patients:  BloggerCourse.com  Fact Sheet for Healthcare Providers:  SeriousBroker.it  This test is no t yet approved or cleared by the [place]  FDA and  has been authorized for detection and/or diagnosis of SARS-CoV-2  by FDA under an Emergency Use Authorization (EUA). This EUA will remain  in effect (meaning this test can be used) for the duration of the COVID-19 declaration under Section 564(b)(1) of the Act, 21 U.S.C.section 360bbb-3(b)(1), unless the authorization is terminated  or revoked sooner.       Influenza A by PCR NEGATIVE NEGATIVE Final   Influenza B by PCR NEGATIVE NEGATIVE Final    Comment: (NOTE) The Xpert Xpress SARS-CoV-2/FLU/RSV plus assay is intended as an aid in the diagnosis of influenza from Nasopharyngeal swab specimens and should not be used as a sole basis for treatment. Nasal washings and aspirates are unacceptable for Xpert Xpress SARS-CoV-2/FLU/RSV testing.  Fact Sheet for Patients: BloggerCourse.com  Fact Sheet for Healthcare Providers: SeriousBroker.it  This test is not yet approved or cleared by the [place]  FDA and has been authorized for detection and/or diagnosis of SARS-CoV-2 by FDA under an Emergency Use Authorization (EUA). This EUA will remain in effect (meaning this test can be used) for the duration of the COVID-19 declaration under Section 564(b)(1) of the Act, 21 U.S.C. section 360bbb-3(b)(1), unless the authorization is terminated or revoked.     Resp Syncytial Virus by PCR NEGATIVE NEGATIVE Final    Comment: (NOTE) Fact Sheet for Patients: BloggerCourse.com  Fact Sheet for Healthcare Providers: SeriousBroker.it  This test is not yet approved or cleared by the [place]  FDA and has been authorized for detection and/or diagnosis of SARS-CoV-2 by FDA under an Emergency Use Authorization (EUA). This EUA will remain in effect (meaning this test can be used) for the duration of the COVID-19 declaration under Section 564(b)(1) of the Act, 21 U.S.C. section 360bbb-3(b)(1), unless the authorization is terminated or revoked.  Performed at [facility], [address] [place], [place] [address]      Radiological Exams on Admission: DG Chest 2 View Result Date: 08/24/2024 CLINICAL DATA:  Shortness of breath. Cough for 1 month. Orthopnea and dyspnea on exertion. Bilateral lower extremity swelling. EXAM: CHEST - 2 VIEW COMPARISON:  03/03/2023 FINDINGS: Mild cardiac enlargement. Low lung volumes with atelectasis in the lung bases. No airspace disease or consolidation. No pleural effusion or pneumothorax. Mediastinal contours appear intact. Calcification of the aorta. Degenerative changes in the spine. IMPRESSION: Cardiac enlargement. Mild atelectasis in the lung bases. No focal consolidation. Electronically Signed   By: Elsie Gravely M.D.   On: 08/24/2024 17:38    EKG: Independently reviewed.  Normal sinus rhythm.  Assessment/Plan Principal Problem:   Lower extremity edema Active Problems:   SOB (shortness of breath)  CKD (chronic kidney disease) stage 4, GFR 15-29 ml/min (HCC)   CAD S/P percutaneous coronary angioplasty   Type 2 diabetes mellitus with vascular disease (HCC)   Anemia   Hypothyroidism   Essential hypertension    Exertional dyspnea with lower extremity edema -    appreciate cardiology consult and recommendation.  Patient's symptoms likely multifactorial including progressive worsening of kidney function versus diastolic dysfunction.  Lasix  20 mg IV has been ordered.  Checking 2D echo.  Closely follow intake output metabolic panel Daily weights.  Check TSH. Chronic kidney disease stage IV creatinine at around baseline.  Has lower EXTR edema Lasix  started as recommended by cardiologist.  Closely follow intake output metabolic panel.  Check Dopplers of the lower extremity. CAD status post PCI denies chest pain.  Troponins are flat.  Checking 2D echo continue antiplatelet agents statins and beta-blockers. Hypertension on amlodipine  carvedilol  and Lasix  at this time. Diabetes mellitus type 2 takes  glipizide and Januvia at home.  On sliding scale coverage.  Check hemoglobin A1c. Anemia likely from renal disease follow CBC.  Hemoglobin at baseline when compared to 1 in Care Everywhere. Hypothyroidism on Synthroid  check TSH.  Since patient has exertional dyspnea with history of chronic kidney disease and diastolic dysfunction will need IV diuresis further management close observation and more than 2 midnight stay.  DVT prophylaxis: Heparin . Code Status: Full code. Family Communication: Patient's grandson. Disposition Plan: Monitored bed. Consults called: Cardiology. Admission status: Observation.

## 2024-08-25 NOTE — Progress Notes (Signed)
  Echocardiogram 2D Echocardiogram has been performed.  LAMON MAXWELL 08/25/2024, 8:51 AM

## 2024-08-25 NOTE — Progress Notes (Addendum)
 PROGRESS NOTE    Patient: Juan Boyd                            PCP: Millicent Sharper, MD                    DOB: [birth year]            DOA: 08/24/2024 FMW:[id number]             DOS: 08/25/2024, 11:32 AM   LOS: 0 days   Date of Service: The patient was seen and examined on 08/25/2024  Subjective:   The patient was seen and examined this morning. Hemodynamically stable.  Denies any chest pain, complaining of shortness of breath with exertion No issues overnight .  Brief Narrative:   Juan Boyd is a 89 y.o. male with history of CAD status post PCI, chronic kidney disease stage IV, diabetes mellitus type 2, hypothyroidism, hypertension presents to the ER because of shortness of breath and lower extremity edema.   Patient has been having the symptoms for almost a month.  Had followed up with her nephrologist recently at [facility] and was considering Lasix  but has not started yet.  Also was concerned that patient had not taken his Synthroid  for last few weeks due to confusion with the tablets.  D Also has some orthopnea.  2D echo done in August 2024 showed normal biventricular function with grade 1 diastolic dysfunction.   ED Course: In the ER chest x-ray was unremarkable.  On exam patient has mild edema of both lower extremities.  Troponin is 22 and proBNP is 3100 hemoglobin 11.5 COVID and flu test negative.    Assessment & Plan:   Principal Problem:   Exertional dyspnea Active Problems:   Lower extremity edema   CKD (chronic kidney disease) stage 4, GFR 15-29 ml/min (HCC)   CAD S/P percutaneous coronary angioplasty   Type 2 diabetes mellitus with vascular disease (HCC)   Anemia   Hypothyroidism   Essential hypertension     Exertional dyspnea with lower extremity edema -     - Hemodynamically stable, satting 100% on room air - Cardiology consulted-appreciate follow-up and input - Symptoms likely multifactorial: CHF, CKD, -Last echo August 2024, EF 60-65%, mild diastolic  dysfunction -Chest x-ray positive for cardiomegaly, minimal congestion, mild atelectasis -Current meds Lasix , carvedilol , -Monitoring I's and O's closely, daily weight - Lasix  20 mg IV has been ordered.      - BNP 311.0 >>    -  TSH: 3.24 - Troponin remained flat 22, 22,   Addendum: 2D echocardiogram: EJF 60 to 65%.  No LVH, LV function within normal limits Left ventricular diastolic parameters are consistent with Grade I diastolic  dysfunction (impaired relaxation).      Chronic kidney disease stage IV  - creatinine at around baseline.   Lab Results  Component Value Date   CREATININE 2.73 (H) 08/25/2024   CREATININE 2.81 (H) 08/24/2024   CREATININE 2.37 (H) 01/27/2023    -  lower EXTR edema Lasix  started as recommended by cardiologist.   - Follow-up labs - Check Dopplers of the lower extremity:  No left lower extremity DVT.   CAD status post PCI   - Denies chest pain.  Troponins are flat.  22, 22 No acute EKG changes - 2D echo:  - continue aspirin , Plavix , carvedilol , Crestor   Hypertension  - Stable, continue amlodipine  carvedilol  and Lasix  at this time.  Diabetes mellitus type 2 : - takes glipizide and Januvia at home.   - On sliding scale coverage.   -  A1c.  Anemia -anemia of chronic disease, - likely from renal disease follow CBC.   Hemoglobin at baseline when compared to Care Everywhere.  Hypothyroidism  - Continue Synthroid  dose  - TSH normal at 3.24      ------------------------------------------------------------------------------------------------------------------ Nutritional status:  The patient's BMI is: Body mass index is 29.55 kg/m. I agree with the assessment and plan as ------------------------------------------------------------------------------------------------------------------------------------------------  Consult: Cardiology   DVT prophylaxis:  heparin  injection 5,000 Units Start: 08/25/24 0600   Code Status:   Code  Status: Full Code  Family Communication: Grandson present at bedside updated -Advance care planning has been discussed.   Admission status:   Status is: Observation The patient remains OBS appropriate and will d/c before 2 midnights.   Disposition: From  - home             Planning for discharge in 1-2 days   Procedures:   No admission procedures for hospital encounter.   Antimicrobials:  Anti-infectives (From admission, onward)    None        Medication:   amLODipine   5 mg Oral Daily   aspirin  EC  81 mg Oral Daily   carvedilol   25 mg Oral BID WC   ezetimibe   10 mg Oral Daily   furosemide   40 mg Intravenous BID   heparin   5,000 Units Subcutaneous Q8H   insulin  aspart  0-6 Units Subcutaneous TID WC   [START ON 08/26/2024] levothyroxine   88 mcg Oral Q0600   omega-3 acid ethyl esters  1 g Oral Daily   rosuvastatin   10 mg Oral Daily    acetaminophen  **OR** acetaminophen , mouth rinse   Objective:   Vitals:   08/24/24 2030 08/24/24 2248 08/25/24 0432 08/25/24 0902  BP: (!) 143/68 (!) 147/70 (!) 148/65 (!) 168/72  Pulse: 68 65 64 (!) 58  Resp: 17 18 18 18   Temp:  98.4 F (36.9 C) 98.1 F (36.7 C)   TempSrc:  Oral Oral   SpO2: 98% 100% 100% 100%  Weight:  75.7 kg    Height:  5' 3 (1.6 m)      Intake/Output Summary (Last 24 hours) at 08/25/2024 1132 Last data filed at 08/25/2024 0902 Gross per 24 hour  Intake 400 ml  Output 2350 ml  Net -1950 ml   Filed Weights   08/24/24 1653 08/24/24 2248  Weight: 76.2 kg 75.7 kg     Physical examination:   General:  AAO x 3,  cooperative, no distress;   HEENT:  Normocephalic, PERRL, otherwise with in Normal limits   Neuro:  CNII-XII intact. , normal motor and sensation, reflexes intact   Lungs:   Clear to auscultation BL, Respirations unlabored,  No wheezes / crackles  Cardio:    S1/S2, RRR, No murmure, No Rubs or Gallops   Abdomen:  Soft, non-tender, bowel sounds active all four quadrants, no guarding or  peritoneal signs.  Muscular  skeletal:  Limited exam -global generalized weaknesses - in bed, able to move all 4 extremities,   2+ pulses,  symmetric, No pitting edema  Skin:  Dry, warm to touch, negative for any Rashes,  Wounds: Please see nursing documentation       ------------------------------------------------------------------------------------------------------------------------------------------    LABs:     Latest Ref Rng & Units 08/25/2024    4:26 AM 08/24/2024    5:15 PM 01/27/2023   11:24 AM  CBC  WBC 4.0 - 10.5 K/uL 6.9  7.6  8.8   Hemoglobin 13.0 - 17.0 g/dL 87.8  88.4  87.8   Hematocrit 39.0 - 52.0 % 37.5  36.2  36.8   Platelets 150 - 400 K/uL 183  184  169       Latest Ref Rng & Units 08/25/2024    4:26 AM 08/24/2024    5:15 PM 01/27/2023   11:24 AM  CMP  Glucose 70 - 99 mg/dL 864  860  91   BUN 8 - 23 mg/dL 24  25  37   Creatinine 0.61 - 1.24 mg/dL 7.26  7.18  7.62   Sodium 135 - 145 mmol/L 139  136  136   Potassium 3.5 - 5.1 mmol/L 4.0  4.3  3.9   Chloride 98 - 111 mmol/L 103  99  105   CO2 22 - 32 mmol/L 24  21  19    Calcium  8.9 - 10.3 mg/dL 8.1  7.8  7.7   Total Protein 6.5 - 8.1 g/dL 6.8   7.9   Total Bilirubin 0.0 - 1.2 mg/dL 0.6   0.5   Alkaline Phos 38 - 126 U/L 69   85   AST 15 - 41 U/L 19   25   ALT 0 - 44 U/L 12   15        Micro Results Recent Results (from the past 240 hours)  Blood culture (routine x 2)     Status: None (Preliminary result)   Collection Time: 08/24/24  5:28 PM   Specimen: BLOOD  Result Value Ref Range Status   Specimen Description   Final    BLOOD LEFT ANTECUBITAL Performed at [facility], [address] [place], [place] [address]    Special Requests   Final    BOTTLES DRAWN AEROBIC AND ANAEROBIC Blood Culture adequate volume Performed at [facility], [address] [place], [place] [address]    Culture   Final    NO GROWTH < 12 HOURS Performed at [facility], [address] [place], [place] [address]    Report Status PENDING  Incomplete  Resp panel by RT-PCR (RSV, Flu A&B, Covid) Anterior Nasal Swab     Status: None   Collection Time: 08/24/24  5:28 PM   Specimen: Anterior Nasal Swab  Result Value Ref Range Status   SARS Coronavirus 2 by RT PCR NEGATIVE NEGATIVE Final    Comment: (NOTE) SARS-CoV-2 target nucleic acids are NOT DETECTED.  The SARS-CoV-2 RNA is generally detectable in upper respiratory specimens during the acute phase of infection. The lowest concentration of SARS-CoV-2 viral copies this assay can detect is 138 copies/mL. A negative result does not preclude SARS-Cov-2 infection and should not be used as the sole basis for treatment or other patient management decisions. A negative result may occur with  improper specimen collection/handling, submission of specimen other than nasopharyngeal swab, presence of viral mutation(s) within the areas targeted by this assay, and inadequate number of viral copies(<138 copies/mL). A negative result must be combined with clinical observations, patient history, and epidemiological information. The expected result is Negative.  Fact Sheet for Patients:  BloggerCourse.com  Fact Sheet for Healthcare Providers:  SeriousBroker.it  This test is no t yet approved or cleared by the [place]  FDA and  has been authorized for detection and/or diagnosis of SARS-CoV-2 by FDA under an Emergency Use Authorization (EUA). This EUA will remain  in effect (meaning this test can be used) for the duration of the COVID-19 declaration under Section 564(b)(1) of the Act, 21 U.S.C.section 360bbb-3(b)(1), unless the authorization is terminated  or revoked sooner.       Influenza A by PCR NEGATIVE NEGATIVE Final   Influenza B by PCR NEGATIVE NEGATIVE Final    Comment: (NOTE) The Xpert Xpress SARS-CoV-2/FLU/RSV plus assay is intended as an aid in the diagnosis of  influenza from Nasopharyngeal swab specimens and should not be used as a sole basis for treatment. Nasal washings and aspirates are unacceptable for Xpert Xpress SARS-CoV-2/FLU/RSV testing.  Fact Sheet for Patients: BloggerCourse.com  Fact Sheet for Healthcare Providers: SeriousBroker.it  This test is not yet approved or cleared by the [place]  FDA and has been authorized for detection and/or diagnosis of SARS-CoV-2 by FDA under an Emergency Use Authorization (EUA). This EUA will remain in effect (meaning this test can be used) for the duration of the COVID-19 declaration under Section 564(b)(1) of the Act, 21 U.S.C. section 360bbb-3(b)(1), unless the authorization is terminated or revoked.     Resp Syncytial Virus by PCR NEGATIVE NEGATIVE Final    Comment: (NOTE) Fact Sheet for Patients: BloggerCourse.com  Fact Sheet for Healthcare Providers: SeriousBroker.it  This test is not yet approved or cleared by the [place]  FDA and has been authorized for detection and/or diagnosis of SARS-CoV-2 by FDA under an Emergency Use Authorization (EUA). This EUA will remain in effect (meaning this test can be used) for the duration of the COVID-19 declaration under Section 564(b)(1) of the Act, 21 U.S.C. section 360bbb-3(b)(1), unless the authorization is terminated or revoked.  Performed at [facility], [address] [place], [place] [address]   Blood culture (routine x 2)     Status: None (Preliminary result)   Collection Time: 08/24/24 11:53 PM   Specimen: BLOOD RIGHT ARM  Result Value Ref Range Status   Specimen Description BLOOD RIGHT ARM  Final   Special Requests   Final    BOTTLES DRAWN AEROBIC AND ANAEROBIC Blood Culture adequate volume   Culture   Final    NO GROWTH < 12 HOURS Performed at [facility], [address] [place], [place] [address]     Report Status PENDING  Incomplete    Radiology Reports DG Chest 2 View Result Date: 08/24/2024 CLINICAL DATA:  Shortness of breath. Cough for 1 month. Orthopnea and dyspnea on exertion. Bilateral lower extremity swelling. EXAM: CHEST - 2 VIEW COMPARISON:  03/03/2023 FINDINGS: Mild cardiac enlargement. Low lung volumes with atelectasis in the lung bases. No airspace disease or consolidation. No pleural effusion or pneumothorax. Mediastinal contours appear intact. Calcification of the aorta. Degenerative changes in the spine. IMPRESSION: Cardiac enlargement. Mild atelectasis in the lung bases. No focal consolidation. Electronically Signed   By: Elsie Gravely M.D.   On: 08/24/2024 17:38    SIGNED: Adriana DELENA Grams, MD, FHM. FAAFP. Jolynn Pack - [facility] hospitalist Time spent - 55 min.  In seeing, evaluating and examining the patient. Reviewing medical records, labs, drawn plan of care. [facility],  Pager (please use [email or web address] to page/ text) Please use Epic Secure Chat for non-urgent communication (7AM-7PM)  If 7PM-7AM, please contact night-coverage [email or web address], 08/25/2024, 11:32 AM

## 2024-08-25 NOTE — TOC Initial Note (Signed)
 Transition of Care [facility]) - Initial/Assessment Note    Patient Details  Name: Juan Boyd MRN: [id number] Date of Birth: 1933-02-14  [facility] CM/SW Contact:    Sudie Erminio Deems, RN Phone Number: 08/25/2024, 11:05 AM  Clinical Narrative:  Patient presented for lower extremity edema and cough. PTA patient was independent from home with spouse. Patient has support of daughters and grandsons that check on him during the day. 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 Patient uses a pill box and takes his medications without any issues. Case Manager will continue to follow for additional transition of care needs as the patient progresses.              Expected Discharge Plan: Home/Self Care Barriers to Discharge: Continued Medical Work up   Patient Goals and CMS Choice Patient states their goals for this hospitalization and ongoing recovery are:: patient will return home with spouse.   Choice offered to / list presented to : NA      Expected Discharge Plan and Services In-house Referral: NA Discharge Planning Services: CM Consult Post Acute Care Choice: NA Living arrangements for the past 2 months: Single Family Home                   DME Agency: NA       HH Arranged: NA  Prior Living Arrangements/Services Living arrangements for the past 2 months: Single Family Home Lives with:: Spouse Patient language and need for interpreter reviewed:: Yes Do you feel safe going back to the place where you live?: Yes      Need for Family Participation in Patient Care: No (Comment) Care giver support system in place?: No (comment)   Criminal Activity/Legal Involvement Pertinent to Current Situation/Hospitalization: No - Comment as needed  Activities of Daily Living   ADL Screening (condition at time of admission) Independently performs ADLs?: Yes (appropriate for developmental age) Is the patient deaf or have difficulty hearing?: No Does the patient have  difficulty seeing, even when wearing glasses/contacts?: No Does the patient have difficulty concentrating, remembering, or making decisions?: No  Permission Sought/Granted Permission sought to share information with : Case Manager, Family Supports    Emotional Assessment Appearance:: Appears stated age Attitude/Demeanor/Rapport: Engaged Affect (typically observed): Appropriate Orientation: : Oriented to Self, Oriented to Place, Oriented to  Time, Oriented to Situation Alcohol / Substance Use: Not Applicable Psych Involvement: No (comment)  Admission diagnosis:  Peripheral edema [R60.0] SOB (shortness of breath) [R06.02] AKI (acute kidney injury) (HCC) [N17.9] Exertional shortness of breath [R06.02] Hypervolemia, unspecified hypervolemia type [E87.70] Acute cough [R05.1] Lower extremity edema [R60.0] Patient Active Problem List   Diagnosis Date Noted   Lower extremity edema 08/25/2024   CKD (chronic kidney disease) stage 4, GFR 15-29 ml/min (HCC) 08/25/2024   CAD S/P percutaneous coronary angioplasty 08/25/2024   Type 2 diabetes mellitus with vascular disease (HCC) 08/25/2024   Anemia 08/25/2024   Hypothyroidism 08/25/2024   Essential hypertension 08/25/2024   Exertional dyspnea 08/24/2024   PCP:  Millicent Sharper, MD Pharmacy:   [facility] [address] [place], [place] - [address] [place] [address] Phone: [phone] Fax: [phone]  Social Drivers of Health (SDOH) Social History: SDOH Screenings   Food Insecurity: No Food Insecurity (08/24/2024)  Housing: Low Risk  (08/24/2024)  Transportation Needs: No Transportation Needs (08/24/2024)  Utilities: Not At Risk (08/24/2024)  Social Connections: Socially Integrated (08/24/2024)  Tobacco Use: Medium Risk (08/25/2024)  Readmission Risk Interventions     No data to display

## 2024-08-25 NOTE — Care Management Obs Status (Signed)
 MEDICARE OBSERVATION STATUS NOTIFICATION   Patient Details  Name: Juan Boyd MRN: [id number] Date of Birth: [birth year]   Medicare Observation Status Notification Given:  Yes    Vonzell Arrie Sharps 08/25/2024, 8:56 AM

## 2024-08-25 NOTE — Progress Notes (Signed)
 PHARMACY - PHYSICIAN COMMUNICATION CRITICAL VALUE ALERT - BLOOD CULTURE IDENTIFICATION (BCID)  Juan Boyd is an 89 y.o. male who presented to [facility] on 08/24/2024 with a chief complaint of SOB and edema.  Assessment:  Pt with 1/4 blood cx bottles growing staph epi - likely contaminant  Name of physician (or Provider) Contacted: Dr. Georgina  Current antibiotics: None  Changes to prescribed antibiotics recommended:  No antibiotics recommended at that time  Results for orders placed or performed during the hospital encounter of 08/24/24  Blood Culture ID Panel (Reflexed) (Collected: 08/24/2024 11:53 PM)  Result Value Ref Range   Enterococcus faecalis NOT DETECTED NOT DETECTED   Enterococcus Faecium NOT DETECTED NOT DETECTED   Listeria monocytogenes NOT DETECTED NOT DETECTED   Staphylococcus species DETECTED (A) NOT DETECTED   Staphylococcus aureus (BCID) NOT DETECTED NOT DETECTED   Staphylococcus epidermidis DETECTED (A) NOT DETECTED   Staphylococcus lugdunensis NOT DETECTED NOT DETECTED   Streptococcus species NOT DETECTED NOT DETECTED   Streptococcus agalactiae NOT DETECTED NOT DETECTED   Streptococcus pneumoniae NOT DETECTED NOT DETECTED   Streptococcus pyogenes NOT DETECTED NOT DETECTED   A.calcoaceticus-baumannii NOT DETECTED NOT DETECTED   Bacteroides fragilis NOT DETECTED NOT DETECTED   Enterobacterales NOT DETECTED NOT DETECTED   Enterobacter cloacae complex NOT DETECTED NOT DETECTED   Escherichia coli NOT DETECTED NOT DETECTED   Klebsiella aerogenes NOT DETECTED NOT DETECTED   Klebsiella oxytoca NOT DETECTED NOT DETECTED   Klebsiella pneumoniae NOT DETECTED NOT DETECTED   Proteus species NOT DETECTED NOT DETECTED   Salmonella species NOT DETECTED NOT DETECTED   Serratia marcescens NOT DETECTED NOT DETECTED   Haemophilus influenzae NOT DETECTED NOT DETECTED   Neisseria meningitidis NOT DETECTED NOT DETECTED   Pseudomonas aeruginosa NOT DETECTED NOT DETECTED    Stenotrophomonas maltophilia NOT DETECTED NOT DETECTED   Candida albicans NOT DETECTED NOT DETECTED   Candida auris NOT DETECTED NOT DETECTED   Candida glabrata NOT DETECTED NOT DETECTED   Candida krusei NOT DETECTED NOT DETECTED   Candida parapsilosis NOT DETECTED NOT DETECTED   Candida tropicalis NOT DETECTED NOT DETECTED   Cryptococcus neoformans/gattii NOT DETECTED NOT DETECTED   Methicillin resistance mecA/C NOT DETECTED NOT DETECTED    Vito Ralph, PharmD, BCPS Please see amion for complete clinical pharmacist phone list 08/25/2024  9:53 PM

## 2024-08-25 NOTE — Progress Notes (Signed)
 Mobility Specialist Progress Note:   08/25/24 1200  Mobility  Activity Ambulated independently  Level of Assistance Independent  Assistive Device None  Distance Ambulated (ft) 500 ft  Activity Response Tolerated well  Mobility Referral Yes  Mobility visit 1 Mobility  Mobility Specialist Start Time (ACUTE ONLY) 1200  Mobility Specialist Stop Time (ACUTE ONLY) 1212  Mobility Specialist Time Calculation (min) (ACUTE ONLY) 12 min   Pt agreeable to mobility session. Required no physical assistance throughout ambulation. VSS on RA. Denies SOB throughout. Back in chair with all needs met.   Therisa Rana Mobility Specialist Please contact via SecureChat or  Rehab office at [phone]

## 2024-08-25 NOTE — Hospital Course (Addendum)
 Juan Boyd is a 89 y.o. male with history of CAD status post PCI, chronic kidney disease stage IV, diabetes mellitus type 2, hypothyroidism, hypertension presents to the ER because of shortness of breath and lower extremity edema.   Patient has been having the symptoms for almost a month.  Had followed up with her nephrologist recently at [facility] and was considering Lasix  but has not started yet.  Also was concerned that patient had not taken his Synthroid  for last few weeks due to confusion with the tablets.  D Also has some orthopnea.  2D echo done in August 2024 showed normal biventricular function with grade 1 diastolic dysfunction.   ED Course: In the ER chest x-ray was unremarkable.  On exam patient has mild edema of both lower extremities.  Troponin is 22 and proBNP is 3100 hemoglobin 11.5 COVID and flu test negative.

## 2024-08-25 NOTE — Progress Notes (Signed)
 PT Cancellation Note  Patient Details Name: Juan Boyd MRN: [id number] DOB: 1933/09/28   Cancelled Treatment:    Reason Eval/Treat Not Completed: PT screened, no needs identified, will sign off Per mobility specialists note, pt ambulating independently without AD. Spoke with pt & family who reports pt is at baseline in regards to mobility & does not require acute PT services at this time. PT to complete orders, please re-consult if new needs arise.  Juan Boyd, PT, DPT 08/25/24, 2:35 PM   Juan Boyd 08/25/2024, 2:35 PM

## 2024-08-26 DIAGNOSIS — I509 Heart failure, unspecified: Secondary | ICD-10-CM

## 2024-08-26 DIAGNOSIS — R600 Localized edema: Secondary | ICD-10-CM | POA: Diagnosis not present

## 2024-08-26 DIAGNOSIS — E039 Hypothyroidism, unspecified: Secondary | ICD-10-CM

## 2024-08-26 DIAGNOSIS — I2510 Atherosclerotic heart disease of native coronary artery without angina pectoris: Secondary | ICD-10-CM

## 2024-08-26 DIAGNOSIS — Z9861 Coronary angioplasty status: Secondary | ICD-10-CM

## 2024-08-26 DIAGNOSIS — N184 Chronic kidney disease, stage 4 (severe): Secondary | ICD-10-CM

## 2024-08-26 DIAGNOSIS — I5033 Acute on chronic diastolic (congestive) heart failure: Secondary | ICD-10-CM | POA: Diagnosis not present

## 2024-08-26 DIAGNOSIS — R0609 Other forms of dyspnea: Secondary | ICD-10-CM | POA: Diagnosis not present

## 2024-08-26 LAB — BASIC METABOLIC PANEL WITH GFR
Anion gap: 15 (ref 5–15)
BUN: 31 mg/dL — ABNORMAL HIGH (ref 8–23)
CO2: 22 mmol/L (ref 22–32)
Calcium: 8.6 mg/dL — ABNORMAL LOW (ref 8.9–10.3)
Chloride: 100 mmol/L (ref 98–111)
Creatinine, Ser: 2.98 mg/dL — ABNORMAL HIGH (ref 0.61–1.24)
GFR, Estimated: 20 mL/min — ABNORMAL LOW (ref >60)
Glucose, Bld: 126 mg/dL — ABNORMAL HIGH (ref 70–99)
Potassium: 4 mmol/L (ref 3.5–5.1)
Sodium: 137 mmol/L (ref 135–145)

## 2024-08-26 LAB — GLUCOSE, CAPILLARY
Glucose-Capillary: 116 mg/dL — ABNORMAL HIGH (ref 70–99)
Glucose-Capillary: 105 mg/dL — ABNORMAL HIGH (ref 70–99)
Glucose-Capillary: 115 mg/dL — ABNORMAL HIGH (ref 70–99)
Glucose-Capillary: 143 mg/dL — ABNORMAL HIGH (ref 70–99)

## 2024-08-26 LAB — BRAIN NATRIURETIC PEPTIDE: B Natriuretic Peptide: 20.9 pg/mL (ref 0.0–100.0)

## 2024-08-26 NOTE — Plan of Care (Signed)

## 2024-08-26 NOTE — Progress Notes (Signed)
 Progress Note  Patient Name: Juan Boyd Date of Encounter: 08/26/2024  Primary Cardiologist: None   Subjective   Patient seen and examined at his bedside. He was sitting up in the bed when I arrived. Happy that his leg edema is improving.   Inpatient Medications    Scheduled Meds:  amLODipine   5 mg Oral Daily   aspirin  EC  81 mg Oral Daily   atorvastatin   40 mg Oral Daily   carvedilol   25 mg Oral BID WC   ezetimibe   10 mg Oral Daily   famotidine   10 mg Oral Daily   furosemide   40 mg Intravenous BID   heparin   5,000 Units Subcutaneous Q8H   insulin  aspart  0-6 Units Subcutaneous TID WC   levothyroxine   88 mcg Oral Q0600   omega-3 acid ethyl esters  1 g Oral Daily   Continuous Infusions:  PRN Meds: acetaminophen  **OR** acetaminophen , guaiFENesin -dextromethorphan , HYDROmorphone  (DILAUDID ) injection, methocarbamol , mouth rinse   Vital Signs    Vitals:   08/25/24 2116 08/26/24 0046 08/26/24 0443 08/26/24 0748  BP: 134/67 139/71 (!) 144/65 (!) 141/66  Pulse: 61 77 64 61  Resp: 18 16 16 15   Temp: 98.2 F (36.8 C) 98 F (36.7 C) 98 F (36.7 C) 98 F (36.7 C)  TempSrc: Oral Oral Oral Oral  SpO2: 99% 99% 96% 99%  Weight:      Height:        Intake/Output Summary (Last 24 hours) at 08/26/2024 0922 Last data filed at 08/26/2024 0443 Gross per 24 hour  Intake 477 ml  Output 1230 ml  Net -753 ml   Filed Weights   08/24/24 1653 08/24/24 2248  Weight: 76.2 kg 75.7 kg    Telemetry     - Personally Reviewed  ECG     - Personally Reviewed  Physical Exam    General: Comfortable, sitting up in the bed Head: Atraumatic, normal size  Eyes: PEERLA, EOMI  Neck: Supple, normal JVD Cardiac: Normal S1, S2; RRR; no murmurs, rubs, or gallops Lungs: Clear to auscultation bilaterally Abd: Soft, nontender, no hepatomegaly  Ext: warm, no edema Musculoskeletal: No deformities, BUE and BLE strength normal and equal Skin: Warm and dry, no rashes   Neuro: Alert and oriented  to person, place, time, and situation, CNII-XII grossly intact, no focal deficits  Psych: Normal mood and affect   Labs    Chemistry Recent Labs  Lab 08/24/24 1715 08/25/24 0426 08/25/24 1551 08/25/24 1926  NA 136 139 138  --   K 4.3 4.0 4.5 3.7  CL 99 103 103  --   CO2 21* 24 14*  --   GLUCOSE 139* 135* 81  --   BUN 25* 24* 29*  --   CREATININE 2.81* 2.73* 2.81*  --   CALCIUM  7.8* 8.1* 8.3*  --   PROT  --  6.8  --   --   ALBUMIN  --  3.5  --   --   AST  --  19  --   --   ALT  --  12  --   --   ALKPHOS  --  69  --   --   BILITOT  --  0.6  --   --   GFRNONAA 21* 22* 21*  --   ANIONGAP 16* 12 21*  --      Hematology Recent Labs  Lab 08/24/24 1715 08/25/24 0426  WBC 7.6 6.9  RBC 4.32 4.44  HGB 11.5* 12.1*  HCT 36.2* 37.5*  MCV 83.8 84.5  MCH 26.6 27.3  MCHC 31.8 32.3  RDW 15.4 15.4  PLT 184 183    Cardiac EnzymesNo results for input(s): TROPONINI in the last 168 hours. No results for input(s): TROPIPOC in the last 168 hours.   BNP Recent Labs  Lab 08/24/24 1728  PROBNP 311.0*     DDimer No results for input(s): DDIMER in the last 168 hours.   Radiology    ECHOCARDIOGRAM COMPLETE Result Date: 08/25/2024    ECHOCARDIOGRAM REPORT   Patient Name:   Juan Boyd  Date of Exam: 08/25/2024 Medical Rec #:  [id number]  Height:       63.0 in Accession #:    [id number] Weight:       166.8 lb Date of Birth:  25-Dec-1933 BSA:          1.790 m Patient Age:    89 years   BP:           148/65 mmHg Patient Gender: M          HR:           57 bpm. Exam Location:  Inpatient Procedure: 2D Echo (Both Spectral and Color Flow Doppler were utilized during            procedure). Indications:    congestive heart failure  History:        Patient has no prior history of Echocardiogram examinations.                 CAD, chronic kidney disease, Signs/Symptoms:Edema; Risk                 Factors:Hypertension, Dyslipidemia and Diabetes.  Sonographer:    Tinnie Barefoot RDCS Referring  Phys: 56 ARSHAD N KAKRAKANDY IMPRESSIONS  1. Left ventricular ejection fraction, by estimation, is 60 to 65%. The left ventricle has normal function. The left ventricle has no regional wall motion abnormalities. Left ventricular diastolic parameters are consistent with Grade I diastolic dysfunction (impaired relaxation).  2. Right ventricular systolic function is low normal. The right ventricular size is normal. Tricuspid regurgitation signal is inadequate for assessing PA pressure.  3. The mitral valve is grossly normal. Trivial mitral valve regurgitation.  4. The aortic valve is tricuspid. Aortic valve regurgitation is not visualized. Aortic valve sclerosis/calcification is present, without any evidence of aortic stenosis.  5. The inferior vena cava is normal in size with greater than 50% respiratory variability, suggesting right atrial pressure of 3 mmHg. Comparison(s): No prior Echocardiogram. FINDINGS  Left Ventricle: Left ventricular ejection fraction, by estimation, is 60 to 65%. The left ventricle has normal function. The left ventricle has no regional wall motion abnormalities. The left ventricular internal cavity size was normal in size. There is  no left ventricular hypertrophy. Left ventricular diastolic parameters are consistent with Grade I diastolic dysfunction (impaired relaxation). Indeterminate filling pressures. Right Ventricle: The right ventricular size is normal. No increase in right ventricular wall thickness. Right ventricular systolic function is low normal. Tricuspid regurgitation signal is inadequate for assessing PA pressure. Left Atrium: Left atrial size was normal in size. Right Atrium: Right atrial size was not well visualized. Pericardium: There is no evidence of pericardial effusion. Mitral Valve: The mitral valve is grossly normal. Trivial mitral valve regurgitation. Tricuspid Valve: The tricuspid valve is normal in structure. Tricuspid valve regurgitation is not demonstrated.  Aortic Valve: The aortic valve is tricuspid. Aortic valve regurgitation is not visualized. Aortic valve sclerosis/calcification is present, without  any evidence of aortic stenosis. Pulmonic Valve: The pulmonic valve was grossly normal. Pulmonic valve regurgitation is trivial. Aorta: The aortic root and ascending aorta are structurally normal, with no evidence of dilitation. Venous: The inferior vena cava is normal in size with greater than 50% respiratory variability, suggesting right atrial pressure of 3 mmHg. IAS/Shunts: No atrial level shunt detected by color flow Doppler.  LEFT VENTRICLE PLAX 2D LVIDd:         4.40 cm   Diastology LVIDs:         2.30 cm   LV e' medial:    4.13 cm/s LV PW:         0.90 cm   LV E/e' medial:  19.2 LV IVS:        1.00 cm   LV e' lateral:   5.00 cm/s LVOT diam:     2.00 cm   LV E/e' lateral: 15.8 LV SV:         59 LV SV Index:   33 LVOT Area:     3.14 cm  RIGHT VENTRICLE             IVC RV Basal diam:  2.60 cm     IVC diam: 1.20 cm RV S prime:     10.90 cm/s TAPSE (M-mode): 1.9 cm LEFT ATRIUM             Index LA diam:        3.40 cm 1.90 cm/m LA Vol (A2C):   32.5 ml 18.16 ml/m LA Vol (A4C):   37.2 ml 20.78 ml/m LA Biplane Vol: 36.5 ml 20.39 ml/m  AORTIC VALVE LVOT Vmax:   71.70 cm/s LVOT Vmean:  48.000 cm/s LVOT VTI:    0.188 m  AORTA Ao Root diam: 3.10 cm Ao Asc diam:  3.10 cm MITRAL VALVE MV Area (PHT): 3.42 cm    SHUNTS MV Decel Time: 222 msec    Systemic VTI:  0.19 m MV E velocity: 79.20 cm/s  Systemic Diam: 2.00 cm MV A velocity: 78.50 cm/s MV E/A ratio:  1.01 Vinie Maxcy MD Electronically signed by Vinie Maxcy MD Signature Date/Time: 08/25/2024/12:05:32 PM    Final    DG Chest 2 View Result Date: 08/24/2024 CLINICAL DATA:  Shortness of breath. Cough for 1 month. Orthopnea and dyspnea on exertion. Bilateral lower extremity swelling. EXAM: CHEST - 2 VIEW COMPARISON:  03/03/2023 FINDINGS: Mild cardiac enlargement. Low lung volumes with atelectasis in the lung bases.  No airspace disease or consolidation. No pleural effusion or pneumothorax. Mediastinal contours appear intact. Calcification of the aorta. Degenerative changes in the spine. IMPRESSION: Cardiac enlargement. Mild atelectasis in the lung bases. No focal consolidation. Electronically Signed   By: Elsie Gravely M.D.   On: 08/24/2024 17:38    Cardiac Studies   echo  Patient Profile     89 y.o. male with diastolic heart failure, here for acute exacerbation of heart failure.  Assessment & Plan    Acute on chronic diastolic heart failure Accelerated hypertension Coronary artery disease status post PCI to the OM also Chronic kidney disease Hypertension Hyperlipidemia Diabetes mellitus Hypothyroidism   Clinically improving but can benefit from one more day of duiretics. Keep the Lasix  40 mg IV twice daily - plan to switch to oral lasix  tomorrow.   Monitor cr closely - slight bump today.   No reports of anginal symptoms.  Continue his Crestor  as well as his aspirin  and Plavix .   Strict input and output and daily  weights      For questions or updates, please contact [facility] Please consult [email or web address] for contact info under Cardiology/STEMI.      Signed, Jashawna Reever, DO  08/26/2024, 9:22 AM

## 2024-08-26 NOTE — Progress Notes (Signed)
 Progress Note   Patient: Juan Boyd FMW:[id number] DOB: 03/06/1933 DOA: 08/24/2024     1 DOS: the patient was seen and examined on 08/26/2024   Brief hospital course: [name] is a 89 y.o. male with history of CAD status post PCI, chronic kidney disease stage IV, diabetes mellitus type 2, hypothyroidism, hypertension presents to the ER because of shortness of breath and lower extremity edema.  He has orthopnea.  2D echo done in August 2024 showed normal biventricular function with grade 1 diastolic dysfunction.   ED Course: In the ER chest x-ray was unremarkable.  On exam patient has mild edema of both lower extremities.  Troponin is 22 and proBNP is 3100 hemoglobin 11.5 COVID and flu test negative.  Assessment and Plan: Acute on chronic diastolic CHF -     Patient presented with leg edema, sob, orthopnea, BNP 3100, Chest x-ray positive for cardiomegaly, minimal congestion, mild atelectasis. Echo -  EF 60 to 65%.  No LVH, LV function within normal limits. Left ventricular diastolic parameters are consistent with Grade I diastolic dysfunction (impaired relaxation). Cardiology follow-up and input appreciated.  Continue Lasix  40mg  BID for another day with plan to switch to oral tomorrow. Continue to monitor I's and O's closely, daily weight  Chronic kidney disease stage IV  Creatinine at around baseline.   Follow daily renal function. Avoid nephrotoxic drugs. No left lower extremity DVT.    CAD status post PCI  Denies chest pain.  Troponins are flat.  22, 22 No acute EKG changes Continue aspirin , Plavix , carvedilol , Crestor    Hypertension  Stable, continue amlodipine  carvedilol  and Lasix  at this time.   Diabetes mellitus type 2 : A1c 7.0 Continue accucheks, sliding scale coverage.     Anemia -anemia of chronic disease, from renal disease follow CBC.  Hemoglobin at baseline when compared to Care Everywhere.   Hypothyroidism  Continue Synthroid  dose  TSH normal at 3.24         Out of bed to chair. Incentive spirometry. Nursing supportive care. Fall, aspiration precautions. Diet:  Diet Orders (From admission, onward)     Start     Ordered   08/25/24 0401  Diet renal/carb modified with fluid restriction Diet-HS Snack? Nothing; Fluid restriction: 1200 mL Fluid; Room service appropriate? Yes; Fluid consistency: Thin  Diet effective now       Question Answer Comment  Diet-HS Snack? Nothing   Fluid restriction: 1200 mL Fluid   Room service appropriate? Yes   Fluid consistency: Thin      08/25/24 0401           DVT prophylaxis: heparin  injection 5,000 Units Start: 08/25/24 0600  Level of care: Telemetry Cardiac   Code Status: Full Code  Subjective: Patient is seen and examined today morning. He is lying in bed. States he feels better. No complaints of chest pain, sob. Did get out of bed.  Physical Exam: Vitals:   08/26/24 0046 08/26/24 0443 08/26/24 0748 08/26/24 1150  BP: 139/71 (!) 144/65 (!) 141/66 110/70  Pulse: 77 64 61 (!) 59  Resp: 16 16 15 16   Temp: 98 F (36.7 C) 98 F (36.7 C) 98 F (36.7 C) 98.2 F (36.8 C)  TempSrc: Oral Oral Oral Oral  SpO2: 99% 96% 99% 98%  Weight:      Height:        General - Elderly African American male, no apparent distress HEENT - PERRLA, EOMI, atraumatic head, non tender sinuses. Lung - Clear, basal rales, no rhonchi,  wheezes. Heart - S1, S2 heard, no murmurs, rubs, trace pedal edema. Abdomen - Soft, non tender, bowel sounds good Neuro - Alert, awake and oriented x 3, non focal exam. Skin - Warm and dry.  Data Reviewed:      Latest Ref Rng & Units 08/25/2024    4:26 AM 08/24/2024    5:15 PM 01/27/2023   11:24 AM  CBC  WBC 4.0 - 10.5 K/uL 6.9  7.6  8.8   Hemoglobin 13.0 - 17.0 g/dL 87.8  88.4  87.8   Hematocrit 39.0 - 52.0 % 37.5  36.2  36.8   Platelets 150 - 400 K/uL 183  184  169       Latest Ref Rng & Units 08/26/2024    9:52 AM 08/25/2024    7:26 PM 08/25/2024    3:51 PM  BMP   Glucose 70 - 99 mg/dL 873   81   BUN 8 - 23 mg/dL 31   29   Creatinine 9.38 - 1.24 mg/dL 7.01   7.18   Sodium 864 - 145 mmol/L 137   138   Potassium 3.5 - 5.1 mmol/L 4.0  3.7  4.5   Chloride 98 - 111 mmol/L 100   103   CO2 22 - 32 mmol/L 22   14   Calcium  8.9 - 10.3 mg/dL 8.6   8.3    ECHOCARDIOGRAM COMPLETE Result Date: 08/25/2024    ECHOCARDIOGRAM REPORT   Patient Name:   Juan Boyd  Date of Exam: 08/25/2024 Medical Rec #:  [id number]  Height:       63.0 in Accession #:    [id number] Weight:       166.8 lb Date of Birth:  1933/05/25 BSA:          1.790 m Patient Age:    89 years   BP:           148/65 mmHg Patient Gender: M          HR:           57 bpm. Exam Location:  Inpatient Procedure: 2D Echo (Both Spectral and Color Flow Doppler were utilized during            procedure). Indications:    congestive heart failure  History:        Patient has no prior history of Echocardiogram examinations.                 CAD, chronic kidney disease, Signs/Symptoms:Edema; Risk                 Factors:Hypertension, Dyslipidemia and Diabetes.  Sonographer:    Tinnie Barefoot RDCS Referring Phys: 25 ARSHAD N KAKRAKANDY IMPRESSIONS  1. Left ventricular ejection fraction, by estimation, is 60 to 65%. The left ventricle has normal function. The left ventricle has no regional wall motion abnormalities. Left ventricular diastolic parameters are consistent with Grade I diastolic dysfunction (impaired relaxation).  2. Right ventricular systolic function is low normal. The right ventricular size is normal. Tricuspid regurgitation signal is inadequate for assessing PA pressure.  3. The mitral valve is grossly normal. Trivial mitral valve regurgitation.  4. The aortic valve is tricuspid. Aortic valve regurgitation is not visualized. Aortic valve sclerosis/calcification is present, without any evidence of aortic stenosis.  5. The inferior vena cava is normal in size with greater than 50% respiratory variability, suggesting  right atrial pressure of 3 mmHg. Comparison(s): No prior Echocardiogram. FINDINGS  Left Ventricle: Left ventricular ejection fraction,  by estimation, is 60 to 65%. The left ventricle has normal function. The left ventricle has no regional wall motion abnormalities. The left ventricular internal cavity size was normal in size. There is  no left ventricular hypertrophy. Left ventricular diastolic parameters are consistent with Grade I diastolic dysfunction (impaired relaxation). Indeterminate filling pressures. Right Ventricle: The right ventricular size is normal. No increase in right ventricular wall thickness. Right ventricular systolic function is low normal. Tricuspid regurgitation signal is inadequate for assessing PA pressure. Left Atrium: Left atrial size was normal in size. Right Atrium: Right atrial size was not well visualized. Pericardium: There is no evidence of pericardial effusion. Mitral Valve: The mitral valve is grossly normal. Trivial mitral valve regurgitation. Tricuspid Valve: The tricuspid valve is normal in structure. Tricuspid valve regurgitation is not demonstrated. Aortic Valve: The aortic valve is tricuspid. Aortic valve regurgitation is not visualized. Aortic valve sclerosis/calcification is present, without any evidence of aortic stenosis. Pulmonic Valve: The pulmonic valve was grossly normal. Pulmonic valve regurgitation is trivial. Aorta: The aortic root and ascending aorta are structurally normal, with no evidence of dilitation. Venous: The inferior vena cava is normal in size with greater than 50% respiratory variability, suggesting right atrial pressure of 3 mmHg. IAS/Shunts: No atrial level shunt detected by color flow Doppler.  LEFT VENTRICLE PLAX 2D LVIDd:         4.40 cm   Diastology LVIDs:         2.30 cm   LV e' medial:    4.13 cm/s LV PW:         0.90 cm   LV E/e' medial:  19.2 LV IVS:        1.00 cm   LV e' lateral:   5.00 cm/s LVOT diam:     2.00 cm   LV E/e' lateral: 15.8 LV  SV:         59 LV SV Index:   33 LVOT Area:     3.14 cm  RIGHT VENTRICLE             IVC RV Basal diam:  2.60 cm     IVC diam: 1.20 cm RV S prime:     10.90 cm/s TAPSE (M-mode): 1.9 cm LEFT ATRIUM             Index LA diam:        3.40 cm 1.90 cm/m LA Vol (A2C):   32.5 ml 18.16 ml/m LA Vol (A4C):   37.2 ml 20.78 ml/m LA Biplane Vol: 36.5 ml 20.39 ml/m  AORTIC VALVE LVOT Vmax:   71.70 cm/s LVOT Vmean:  48.000 cm/s LVOT VTI:    0.188 m  AORTA Ao Root diam: 3.10 cm Ao Asc diam:  3.10 cm MITRAL VALVE MV Area (PHT): 3.42 cm    SHUNTS MV Decel Time: 222 msec    Systemic VTI:  0.19 m MV E velocity: 79.20 cm/s  Systemic Diam: 2.00 cm MV A velocity: 78.50 cm/s MV E/A ratio:  1.01 Vinie Maxcy MD Electronically signed by Vinie Maxcy MD Signature Date/Time: 08/25/2024/12:05:32 PM    Final    DG Chest 2 View Result Date: 08/24/2024 CLINICAL DATA:  Shortness of breath. Cough for 1 month. Orthopnea and dyspnea on exertion. Bilateral lower extremity swelling. EXAM: CHEST - 2 VIEW COMPARISON:  03/03/2023 FINDINGS: Mild cardiac enlargement. Low lung volumes with atelectasis in the lung bases. No airspace disease or consolidation. No pleural effusion or pneumothorax. Mediastinal contours appear intact. Calcification of the aorta.  Degenerative changes in the spine. IMPRESSION: Cardiac enlargement. Mild atelectasis in the lung bases. No focal consolidation. Electronically Signed   By: Elsie Gravely M.D.   On: 08/24/2024 17:38    Family Communication: Discussed with patient, understand and agree. All questions answered.  Disposition: Status is: Inpatient Remains inpatient appropriate because: IV lasix , PT/ OT  Planned Discharge Destination: Home     Time spent: 46 minutes  Author: Concepcion Riser, MD 08/26/2024 2:11 PM Secure chat 7am to 7pm For on call review [email or web address].

## 2024-08-26 NOTE — Plan of Care (Signed)
  Problem: Clinical Measurements: Goal: Respiratory complications will improve Outcome: Progressing Goal: Cardiovascular complication will be avoided Outcome: Progressing   Problem: Pain Managment: Goal: General experience of comfort will improve and/or be controlled Outcome: Progressing   Problem: Safety: Goal: Ability to remain free from injury will improve Outcome: Progressing   Problem: Activity: Goal: Capacity to carry out activities will improve Outcome: Progressing   Problem: Cardiac: Goal: Ability to achieve and maintain adequate cardiopulmonary perfusion will improve Outcome: Progressing

## 2024-08-27 DIAGNOSIS — I2510 Atherosclerotic heart disease of native coronary artery without angina pectoris: Secondary | ICD-10-CM | POA: Diagnosis not present

## 2024-08-27 DIAGNOSIS — R600 Localized edema: Secondary | ICD-10-CM | POA: Diagnosis not present

## 2024-08-27 DIAGNOSIS — R0609 Other forms of dyspnea: Secondary | ICD-10-CM | POA: Diagnosis not present

## 2024-08-27 DIAGNOSIS — I509 Heart failure, unspecified: Secondary | ICD-10-CM | POA: Diagnosis not present

## 2024-08-27 DIAGNOSIS — I5033 Acute on chronic diastolic (congestive) heart failure: Secondary | ICD-10-CM | POA: Diagnosis not present

## 2024-08-27 LAB — CBC
HCT: 41.4 % (ref 39.0–52.0)
Hemoglobin: 13.3 g/dL (ref 13.0–17.0)
MCH: 26.8 pg (ref 26.0–34.0)
MCHC: 32.1 g/dL (ref 30.0–36.0)
MCV: 83.5 fL (ref 80.0–100.0)
Platelets: 201 K/uL (ref 150–400)
RBC: 4.96 MIL/uL (ref 4.22–5.81)
RDW: 15.2 % (ref 11.5–15.5)
WBC: 8.4 K/uL (ref 4.0–10.5)
nRBC: 0 % (ref 0.0–0.2)

## 2024-08-27 LAB — CULTURE, BLOOD (ROUTINE X 2): Special Requests: ADEQUATE

## 2024-08-27 LAB — BASIC METABOLIC PANEL WITH GFR
Anion gap: 15 (ref 5–15)
BUN: 44 mg/dL — ABNORMAL HIGH (ref 8–23)
CO2: 25 mmol/L (ref 22–32)
Calcium: 8.4 mg/dL — ABNORMAL LOW (ref 8.9–10.3)
Chloride: 95 mmol/L — ABNORMAL LOW (ref 98–111)
Creatinine, Ser: 3.2 mg/dL — ABNORMAL HIGH (ref 0.61–1.24)
GFR, Estimated: 18 mL/min — ABNORMAL LOW (ref >60)
Glucose, Bld: 128 mg/dL — ABNORMAL HIGH (ref 70–99)
Potassium: 4.4 mmol/L (ref 3.5–5.1)
Sodium: 135 mmol/L (ref 135–145)

## 2024-08-27 LAB — GLUCOSE, CAPILLARY
Glucose-Capillary: 108 mg/dL — ABNORMAL HIGH (ref 70–99)
Glucose-Capillary: 93 mg/dL (ref 70–99)
Glucose-Capillary: 92 mg/dL (ref 70–99)
Glucose-Capillary: 132 mg/dL — ABNORMAL HIGH (ref 70–99)

## 2024-08-27 LAB — BRAIN NATRIURETIC PEPTIDE: B Natriuretic Peptide: 18.1 pg/mL (ref 0.0–100.0)

## 2024-08-27 NOTE — Plan of Care (Signed)
  Problem: Health Behavior/Discharge Planning: Goal: Ability to manage health-related needs will improve Outcome: Progressing   Problem: Clinical Measurements: Goal: Respiratory complications will improve Outcome: Progressing Goal: Cardiovascular complication will be avoided Outcome: Progressing   Problem: Elimination: Goal: Will not experience complications related to urinary retention Outcome: Progressing   Problem: Safety: Goal: Ability to remain free from injury will improve Outcome: Progressing   Problem: Cardiac: Goal: Ability to achieve and maintain adequate cardiopulmonary perfusion will improve Outcome: Progressing

## 2024-08-27 NOTE — Progress Notes (Signed)
 Progress Note  Patient Name: Juan Boyd Date of Encounter: 08/27/2024  Primary Cardiologist: None   Subjective   Patient seen and examined at his bedside..   Inpatient Medications    Scheduled Meds:  amLODipine   5 mg Oral Daily   aspirin  EC  81 mg Oral Daily   atorvastatin   40 mg Oral Daily   carvedilol   25 mg Oral BID WC   ezetimibe   10 mg Oral Daily   famotidine   10 mg Oral Daily   heparin   5,000 Units Subcutaneous Q8H   insulin  aspart  0-6 Units Subcutaneous TID WC   levothyroxine   88 mcg Oral Q0600   omega-3 acid ethyl esters  1 g Oral Daily   Continuous Infusions:  PRN Meds: acetaminophen  **OR** acetaminophen , guaiFENesin -dextromethorphan , HYDROmorphone  (DILAUDID ) injection, methocarbamol , mouth rinse   Vital Signs    Vitals:   08/27/24 0003 08/27/24 0415 08/27/24 0643 08/27/24 0740  BP: 111/71 126/74  (!) 142/71  Pulse: 66 63 63 (!) 59  Resp: 17 16  18   Temp: 98 F (36.7 C) 97.8 F (36.6 C)  97.9 F (36.6 C)  TempSrc: Oral Oral  Oral  SpO2: 98% 100% 98% 98%  Weight:      Height:        Intake/Output Summary (Last 24 hours) at 08/27/2024 0953 Last data filed at 08/27/2024 9257 Gross per 24 hour  Intake 0 ml  Output 1050 ml  Net -1050 ml   Filed Weights   08/24/24 1653 08/24/24 2248  Weight: 76.2 kg 75.7 kg    Telemetry     - Personally Reviewed  ECG     - Personally Reviewed  Physical Exam    General: Comfortable, sitting up in the bed Head: Atraumatic, normal size  Eyes: PEERLA, EOMI  Neck: Supple, normal JVD Cardiac: Normal S1, S2; RRR; no murmurs, rubs, or gallops Lungs: Clear to auscultation bilaterally Abd: Soft, nontender, no hepatomegaly  Ext: warm, no edema Musculoskeletal: No deformities, BUE and BLE strength normal and equal Skin: Warm and dry, no rashes   Neuro: Alert and oriented to person, place, time, and situation, CNII-XII grossly intact, no focal deficits  Psych: Normal mood and affect   Labs     Chemistry Recent Labs  Lab 08/25/24 0426 08/25/24 1551 08/25/24 1926 08/26/24 0952 08/27/24 0528  NA 139 138  --  137 135  K 4.0 4.5 3.7 4.0 4.4  CL 103 103  --  100 95*  CO2 24 14*  --  22 25  GLUCOSE 135* 81  --  126* 128*  BUN 24* 29*  --  31* 44*  CREATININE 2.73* 2.81*  --  2.98* 3.20*  CALCIUM  8.1* 8.3*  --  8.6* 8.4*  PROT 6.8  --   --   --   --   ALBUMIN 3.5  --   --   --   --   AST 19  --   --   --   --   ALT 12  --   --   --   --   ALKPHOS 69  --   --   --   --   BILITOT 0.6  --   --   --   --   GFRNONAA 22* 21*  --  20* 18*  ANIONGAP 12 21*  --  15 15     Hematology Recent Labs  Lab 08/24/24 1715 08/25/24 0426 08/27/24 0528  WBC 7.6 6.9 8.4  RBC 4.32  4.44 4.96  HGB 11.5* 12.1* 13.3  HCT 36.2* 37.5* 41.4  MCV 83.8 84.5 83.5  MCH 26.6 27.3 26.8  MCHC 31.8 32.3 32.1  RDW 15.4 15.4 15.2  PLT 184 183 201    Cardiac EnzymesNo results for input(s): TROPONINI in the last 168 hours. No results for input(s): TROPIPOC in the last 168 hours.   BNP Recent Labs  Lab 08/24/24 1728 08/26/24 0952 08/27/24 0528  BNP  --  20.9 18.1  PROBNP 311.0*  --   --      DDimer No results for input(s): DDIMER in the last 168 hours.   Radiology    No results found.   Cardiac Studies   echo  Patient Profile     89 y.o. male with diastolic heart failure, here for acute exacerbation of heart failure.  Assessment & Plan    Acute on chronic diastolic heart failure Accelerated hypertension Coronary artery disease status post PCI to the OM also Chronic kidney disease Hypertension Hyperlipidemia Diabetes mellitus Hypothyroidism   Clinically improve - appears Euvolemic. Stop diuretics.  Creatinine worsened compared to yesterday.  No reports of anginal symptoms.  Continue his Crestor  as well as his aspirin  and Plavix .   Strict input and output and daily weights   I think he would benefit from 1 more day of admission to monitor his kidney function as  it goes back down close to baseline  For questions or updates, please contact [facility] Please consult [email or web address] for contact info under Cardiology/STEMI.      Signed, Jaretzi Droz, DO  08/27/2024, 9:53 AM

## 2024-08-27 NOTE — Progress Notes (Signed)
 Progress Note   Patient: Juan Boyd FMW:[id number] DOB: 1933-08-27 DOA: 08/24/2024     2 DOS: the patient was seen and examined on 08/27/2024   Brief hospital course: Juan Boyd is a 89 y.o. male with history of CAD status post PCI, chronic kidney disease stage IV, diabetes mellitus type 2, hypothyroidism, hypertension presents to the ER because of shortness of breath and lower extremity edema.  He has orthopnea.  2D echo done in August 2024 showed normal biventricular function with grade 1 diastolic dysfunction.   ED Course: In the ER chest x-ray was unremarkable.  On exam patient has mild edema of both lower extremities.  Troponin is 22 and proBNP is 3100 hemoglobin 11.5 COVID and flu test negative.  Assessment and Plan: Acute on chronic diastolic CHF -     Patient presented with leg edema, sob, orthopnea, BNP 3100, Chest x-ray positive for cardiomegaly, minimal congestion, mild atelectasis. Echo -  EF 60 to 65%.  No LVH, LV function within normal limits. Left ventricular diastolic parameters are consistent with Grade I diastolic dysfunction (impaired relaxation). Cardiology follow-up and input appreciated.  Hold Lasix  40mg  due to renal dysfunction. Continue to monitor I's and O's closely, daily weight  Acute on Chronic kidney disease stage IV  Creatinine worsened with diuresis. Hold Lasix . Follow daily renal function. Avoid nephrotoxic drugs.   CAD status post PCI  Denies chest pain.  Troponins are flat.  22, 22 No acute EKG changes Continue aspirin , Plavix , carvedilol , Crestor    Hypertension  Stable, continue amlodipine  carvedilol  and Lasix  at this time.   Diabetes mellitus type 2 : A1c 7.0 Continue accucheks, sliding scale coverage.     Anemia of chronic disease, from renal disease.  Hemoglobin at baseline when compared to Care Everywhere.   Hypothyroidism  Continue Synthroid  dose  TSH normal at 3.24        Out of bed to chair. Incentive spirometry. Nursing  supportive care. Fall, aspiration precautions. Diet:  Diet Orders (From admission, onward)     Start     Ordered   08/25/24 0401  Diet renal/carb modified with fluid restriction Diet-HS Snack? Nothing; Fluid restriction: 1200 mL Fluid; Room service appropriate? Yes; Fluid consistency: Thin  Diet effective now       Question Answer Comment  Diet-HS Snack? Nothing   Fluid restriction: 1200 mL Fluid   Room service appropriate? Yes   Fluid consistency: Thin      08/25/24 0401           DVT prophylaxis: heparin  injection 5,000 Units Start: 08/25/24 0600  Level of care: Telemetry Cardiac   Code Status: Full Code  Subjective: Patient is seen and examined today morning. He is lying in bed. Feels better.Wishes to go home. Did get out of bed.  Physical Exam: Vitals:   08/27/24 0003 08/27/24 0415 08/27/24 0643 08/27/24 0740  BP: 111/71 126/74  (!) 142/71  Pulse: 66 63 63 (!) 59  Resp: 17 16  18   Temp: 98 F (36.7 C) 97.8 F (36.6 C)  97.9 F (36.6 C)  TempSrc: Oral Oral  Oral  SpO2: 98% 100% 98% 98%  Weight:      Height:        General - Elderly African American male, no apparent distress HEENT - PERRLA, EOMI, atraumatic head, non tender sinuses. Lung - Clear, basal rales, no rhonchi, wheezes. Heart - S1, S2 heard, no murmurs, rubs, trace pedal edema. Abdomen - Soft, non tender, bowel sounds good Neuro - Alert,  awake and oriented x 3, non focal exam. Skin - Warm and dry.  Data Reviewed:      Latest Ref Rng & Units 08/27/2024    5:28 AM 08/25/2024    4:26 AM 08/24/2024    5:15 PM  CBC  WBC 4.0 - 10.5 K/uL 8.4  6.9  7.6   Hemoglobin 13.0 - 17.0 g/dL 86.6  87.8  88.4   Hematocrit 39.0 - 52.0 % 41.4  37.5  36.2   Platelets 150 - 400 K/uL 201  183  184       Latest Ref Rng & Units 08/27/2024    5:28 AM 08/26/2024    9:52 AM 08/25/2024    7:26 PM  BMP  Glucose 70 - 99 mg/dL 871  873    BUN 8 - 23 mg/dL 44  31    Creatinine 9.38 - 1.24 mg/dL 6.79  7.01    Sodium 864  - 145 mmol/L 135  137    Potassium 3.5 - 5.1 mmol/L 4.4  4.0  3.7   Chloride 98 - 111 mmol/L 95  100    CO2 22 - 32 mmol/L 25  22    Calcium  8.9 - 10.3 mg/dL 8.4  8.6     No results found.   Family Communication: Discussed with patient, understand and agree. All questions answered.  Disposition: Status is: Inpatient Remains inpatient appropriate because: follow renal function off diuretics.  Planned Discharge Destination: Home     Time spent: 43 minutes  Author: Concepcion Riser, MD 08/27/2024 10:26 AM Secure chat 7am to 7pm For on call review [email or web address].

## 2024-08-27 NOTE — Plan of Care (Signed)

## 2024-08-27 NOTE — Progress Notes (Signed)
 PT Cancellation Note  Patient Details Name: Juan Boyd MRN: [id number] DOB: 1933/11/09   Cancelled Treatment:    Reason Eval/Treat Not Completed: PT screened, no needs identified, will sign off. PT met with patient and family on Friday 8/29 and determined pt was at mobility baseline, independent, and with no acute PT needs. Per discussion with RN this morning, pt still ambulating independently on the unit and no acute PT needs. Please reach out if new needs or concerns arise.   Izetta Call, PT, DPT   Acute Rehabilitation Department Office [phone] Secure Chat Communication Preferred   Izetta JULIANNA Call 08/27/2024, 10:29 AM

## 2024-08-27 NOTE — Progress Notes (Signed)
 OT Cancellation Note  Patient Details Name: Armani Brar MRN: [id number] DOB: 09/13/1933   Cancelled Treatment:    Reason Eval/Treat Not Completed: OT screened, no needs identified, will sign off. Per RN, pt up and ambulating independently in the hall this morning. Per PT notes, family confirms that pt is at functional baseline. No needs identified at this time.   Elma JONETTA Lebron FREDERICK, OTR/L [facility] Acute Rehabilitation Office: [phone]   Elma JONETTA Lebron 08/27/2024, 10:31 AM

## 2024-08-28 DIAGNOSIS — N1832 Chronic kidney disease, stage 3b: Secondary | ICD-10-CM

## 2024-08-28 DIAGNOSIS — N179 Acute kidney failure, unspecified: Secondary | ICD-10-CM

## 2024-08-28 DIAGNOSIS — I509 Heart failure, unspecified: Secondary | ICD-10-CM | POA: Diagnosis not present

## 2024-08-28 DIAGNOSIS — I5033 Acute on chronic diastolic (congestive) heart failure: Secondary | ICD-10-CM | POA: Diagnosis not present

## 2024-08-28 DIAGNOSIS — I2510 Atherosclerotic heart disease of native coronary artery without angina pectoris: Secondary | ICD-10-CM | POA: Diagnosis not present

## 2024-08-28 DIAGNOSIS — E1159 Type 2 diabetes mellitus with other circulatory complications: Secondary | ICD-10-CM | POA: Diagnosis not present

## 2024-08-28 LAB — CBC
HCT: 40.7 % (ref 39.0–52.0)
Hemoglobin: 13.1 g/dL (ref 13.0–17.0)
MCH: 27 pg (ref 26.0–34.0)
MCHC: 32.2 g/dL (ref 30.0–36.0)
MCV: 83.9 fL (ref 80.0–100.0)
Platelets: 199 K/uL (ref 150–400)
RBC: 4.85 MIL/uL (ref 4.22–5.81)
RDW: 15.2 % (ref 11.5–15.5)
WBC: 9.2 K/uL (ref 4.0–10.5)
nRBC: 0 % (ref 0.0–0.2)

## 2024-08-28 LAB — BASIC METABOLIC PANEL WITH GFR
Anion gap: 15 (ref 5–15)
BUN: 48 mg/dL — ABNORMAL HIGH (ref 8–23)
CO2: 23 mmol/L (ref 22–32)
Calcium: 7.7 mg/dL — ABNORMAL LOW (ref 8.9–10.3)
Chloride: 96 mmol/L — ABNORMAL LOW (ref 98–111)
Creatinine, Ser: 3.19 mg/dL — ABNORMAL HIGH (ref 0.61–1.24)
GFR, Estimated: 18 mL/min — ABNORMAL LOW (ref >60)
Glucose, Bld: 118 mg/dL — ABNORMAL HIGH (ref 70–99)
Potassium: 3.7 mmol/L (ref 3.5–5.1)
Sodium: 134 mmol/L — ABNORMAL LOW (ref 135–145)

## 2024-08-28 LAB — BRAIN NATRIURETIC PEPTIDE: B Natriuretic Peptide: 15.3 pg/mL (ref 0.0–100.0)

## 2024-08-28 LAB — GLUCOSE, CAPILLARY
Glucose-Capillary: 203 mg/dL — ABNORMAL HIGH (ref 70–99)
Glucose-Capillary: 123 mg/dL — ABNORMAL HIGH (ref 70–99)

## 2024-08-28 MED ORDER — ONDANSETRON 4 MG PO TBDP
4.0000 mg | ORAL_TABLET | Freq: Once | ORAL | Status: DC
  Filled 2024-08-28: qty 1

## 2024-08-28 MED ORDER — ONDANSETRON 4 MG PO TBDP: 4.0000 mg | ORAL_TABLET | Freq: Three times a day (TID) | ORAL | 0 refills | Status: AC | PRN

## 2024-08-28 MED ORDER — COLCHICINE 0.3 MG HALF TABLET
0.3000 mg | ORAL_TABLET | Freq: Once | ORAL | Status: DC
  Filled 2024-08-28: qty 1

## 2024-08-28 NOTE — Progress Notes (Signed)
 Progress Note  Patient Name: Juan Boyd Date of Encounter: 08/28/2024  Primary Cardiologist: None   Subjective   Patient seen and examined at his bedside..   Inpatient Medications    Scheduled Meds:  amLODipine   5 mg Oral Daily   aspirin  EC  81 mg Oral Daily   atorvastatin   40 mg Oral Daily   carvedilol   25 mg Oral BID WC   ezetimibe   10 mg Oral Daily   famotidine   10 mg Oral Daily   heparin   5,000 Units Subcutaneous Q8H   insulin  aspart  0-6 Units Subcutaneous TID WC   levothyroxine   88 mcg Oral Q0600   omega-3 acid ethyl esters  1 g Oral Daily   Continuous Infusions:  PRN Meds: acetaminophen  **OR** acetaminophen , guaiFENesin -dextromethorphan , HYDROmorphone  (DILAUDID ) injection, methocarbamol , mouth rinse   Vital Signs    Vitals:   08/27/24 1943 08/27/24 1953 08/28/24 0007 08/28/24 0416  BP: 126/73 115/74 123/72 (!) 140/78  Pulse: 70 70 72 68  Resp: 18 16 16 18   Temp: 98.1 F (36.7 C) 98.2 F (36.8 C) 98.2 F (36.8 C) 98 F (36.7 C)  TempSrc: Oral Oral Oral Oral  SpO2: 97% 97% 98% 97%  Weight:      Height:        Intake/Output Summary (Last 24 hours) at 08/28/2024 0739 Last data filed at 08/28/2024 0534 Gross per 24 hour  Intake --  Output 925 ml  Net -925 ml   Filed Weights   08/24/24 1653 08/24/24 2248  Weight: 76.2 kg 75.7 kg    Telemetry     - Personally Reviewed  ECG     - Personally Reviewed  Physical Exam    General: Comfortable, sitting up in the bed Head: Atraumatic, normal size  Eyes: PEERLA, EOMI  Neck: Supple, normal JVD Cardiac: Normal S1, S2; RRR; no murmurs, rubs, or gallops Lungs: Clear to auscultation bilaterally Abd: Soft, nontender, no hepatomegaly  Ext: warm, no edema Musculoskeletal: No deformities, BUE and BLE strength normal and equal Skin: Warm and dry, no rashes   Neuro: Alert and oriented to person, place, time, and situation, CNII-XII grossly intact, no focal deficits  Psych: Normal mood and affect   Labs     Chemistry Recent Labs  Lab 08/25/24 0426 08/25/24 1551 08/26/24 0952 08/27/24 0528 08/28/24 0234  NA 139   < > 137 135 134*  K 4.0   < > 4.0 4.4 3.7  CL 103   < > 100 95* 96*  CO2 24   < > 22 25 23   GLUCOSE 135*   < > 126* 128* 118*  BUN 24*   < > 31* 44* 48*  CREATININE 2.73*   < > 2.98* 3.20* 3.19*  CALCIUM  8.1*   < > 8.6* 8.4* 7.7*  PROT 6.8  --   --   --   --   ALBUMIN 3.5  --   --   --   --   AST 19  --   --   --   --   ALT 12  --   --   --   --   ALKPHOS 69  --   --   --   --   BILITOT 0.6  --   --   --   --   GFRNONAA 22*   < > 20* 18* 18*  ANIONGAP 12   < > 15 15 15    < > = values in this interval not  displayed.     Hematology Recent Labs  Lab 08/25/24 0426 08/27/24 0528 08/28/24 0234  WBC 6.9 8.4 9.2  RBC 4.44 4.96 4.85  HGB 12.1* 13.3 13.1  HCT 37.5* 41.4 40.7  MCV 84.5 83.5 83.9  MCH 27.3 26.8 27.0  MCHC 32.3 32.1 32.2  RDW 15.4 15.2 15.2  PLT 183 201 199    Cardiac EnzymesNo results for input(s): TROPONINI in the last 168 hours. No results for input(s): TROPIPOC in the last 168 hours.   BNP Recent Labs  Lab 08/24/24 1728 08/26/24 0952 08/27/24 0528 08/28/24 0234  BNP  --  20.9 18.1 15.3  PROBNP 311.0*  --   --   --      DDimer No results for input(s): DDIMER in the last 168 hours.   Radiology    No results found.   Cardiac Studies   echo  Patient Profile     89 y.o. male with diastolic heart failure, here for acute exacerbation of heart failure.  Assessment & Plan    Acute on chronic diastolic heart failure Accelerated hypertension Coronary artery disease status post PCI to the OM also Chronic kidney disease Hypertension Hyperlipidemia Diabetes mellitus Hypothyroidism   Clinically improve - appears Euvolemic. Stopped diuretics yesterday.  No change in Cr compared to yesterday   No reports of anginal symptoms.  Continue his Crestor  as well as his aspirin  and Plavix .   Strict input and output and daily  weights  He could be discharged with follow up labs, but will leave to the primary team.   For questions or updates, please contact [facility] Please consult [email or web address] for contact info under Cardiology/STEMI.      Signed, Zakiah Gauthreaux, DO  08/28/2024, 7:39 AM

## 2024-08-28 NOTE — Plan of Care (Signed)
 Problem: Education: Goal: Knowledge of General Education information will improve Description: Including pain rating scale, medication(s)/side effects and non-pharmacologic comfort measures 08/28/2024 1106 by Emil Roselie SAUNDERS, RN Outcome: Adequate for Discharge 08/28/2024 1105 by Emil Roselie SAUNDERS, RN Outcome: Adequate for Discharge   Problem: Health Behavior/Discharge Planning: Goal: Ability to manage health-related needs will improve 08/28/2024 1106 by Emil Roselie SAUNDERS, RN Outcome: Adequate for Discharge 08/28/2024 1105 by Emil Roselie SAUNDERS, RN Outcome: Adequate for Discharge   Problem: Clinical Measurements: Goal: Ability to maintain clinical measurements within normal limits will improve 08/28/2024 1106 by Emil Roselie SAUNDERS, RN Outcome: Adequate for Discharge 08/28/2024 1105 by Emil Roselie SAUNDERS, RN Outcome: Adequate for Discharge Goal: Will remain free from infection 08/28/2024 1106 by Emil Roselie SAUNDERS, RN Outcome: Adequate for Discharge 08/28/2024 1105 by Emil Roselie SAUNDERS, RN Outcome: Adequate for Discharge Goal: Diagnostic test results will improve 08/28/2024 1106 by Emil Roselie SAUNDERS, RN Outcome: Adequate for Discharge 08/28/2024 1105 by Emil Roselie SAUNDERS, RN Outcome: Adequate for Discharge Goal: Respiratory complications will improve 08/28/2024 1106 by Emil Roselie SAUNDERS, RN Outcome: Adequate for Discharge 08/28/2024 1105 by Emil Roselie SAUNDERS, RN Outcome: Adequate for Discharge Goal: Cardiovascular complication will be avoided 08/28/2024 1106 by Emil Roselie SAUNDERS, RN Outcome: Adequate for Discharge 08/28/2024 1105 by Emil Roselie SAUNDERS, RN Outcome: Adequate for Discharge   Problem: Activity: Goal: Risk for activity intolerance will decrease 08/28/2024 1106 by Emil Roselie SAUNDERS, RN Outcome: Adequate for Discharge 08/28/2024 1105 by Emil Roselie SAUNDERS, RN Outcome: Adequate for Discharge   Problem: Nutrition: Goal: Adequate nutrition will be maintained 08/28/2024 1106 by  Emil Roselie SAUNDERS, RN Outcome: Adequate for Discharge 08/28/2024 1105 by Emil Roselie SAUNDERS, RN Outcome: Adequate for Discharge   Problem: Coping: Goal: Level of anxiety will decrease 08/28/2024 1106 by Emil Roselie SAUNDERS, RN Outcome: Adequate for Discharge 08/28/2024 1105 by Emil Roselie SAUNDERS, RN Outcome: Adequate for Discharge   Problem: Elimination: Goal: Will not experience complications related to bowel motility 08/28/2024 1106 by Emil Roselie SAUNDERS, RN Outcome: Adequate for Discharge 08/28/2024 1105 by Emil Roselie SAUNDERS, RN Outcome: Adequate for Discharge Goal: Will not experience complications related to urinary retention 08/28/2024 1106 by Emil Roselie SAUNDERS, RN Outcome: Adequate for Discharge 08/28/2024 1105 by Emil Roselie SAUNDERS, RN Outcome: Adequate for Discharge   Problem: Pain Managment: Goal: General experience of comfort will improve and/or be controlled 08/28/2024 1106 by Emil Roselie SAUNDERS, RN Outcome: Adequate for Discharge 08/28/2024 1105 by Emil Roselie SAUNDERS, RN Outcome: Adequate for Discharge   Problem: Safety: Goal: Ability to remain free from injury will improve 08/28/2024 1106 by Emil Roselie SAUNDERS, RN Outcome: Adequate for Discharge 08/28/2024 1105 by Emil Roselie SAUNDERS, RN Outcome: Adequate for Discharge   Problem: Skin Integrity: Goal: Risk for impaired skin integrity will decrease 08/28/2024 1106 by Emil Roselie SAUNDERS, RN Outcome: Adequate for Discharge 08/28/2024 1105 by Emil Roselie SAUNDERS, RN Outcome: Adequate for Discharge   Problem: Education: Goal: Ability to demonstrate management of disease process will improve 08/28/2024 1106 by Emil Roselie SAUNDERS, RN Outcome: Adequate for Discharge 08/28/2024 1105 by Emil Roselie SAUNDERS, RN Outcome: Adequate for Discharge Goal: Ability to verbalize understanding of medication therapies will improve 08/28/2024 1106 by Emil Roselie SAUNDERS, RN Outcome: Adequate for Discharge 08/28/2024 1105 by Emil Roselie SAUNDERS, RN Outcome:  Adequate for Discharge Goal: Individualized Educational Video(s) 08/28/2024 1106 by Emil Roselie SAUNDERS, RN Outcome: Adequate for Discharge 08/28/2024 1105 by Emil Roselie SAUNDERS, RN Outcome: Adequate for Discharge   Problem: Activity: Goal: Capacity  to carry out activities will improve 08/28/2024 1106 by Emil Roselie SAUNDERS, RN Outcome: Adequate for Discharge 08/28/2024 1105 by Emil Roselie SAUNDERS, RN Outcome: Adequate for Discharge   Problem: Cardiac: Goal: Ability to achieve and maintain adequate cardiopulmonary perfusion will improve 08/28/2024 1106 by Emil Roselie SAUNDERS, RN Outcome: Adequate for Discharge 08/28/2024 1105 by Emil Roselie SAUNDERS, RN Outcome: Adequate for Discharge   Problem: Education: Goal: Ability to describe self-care measures that may prevent or decrease complications (Diabetes Survival Skills Education) will improve 08/28/2024 1106 by Emil Roselie SAUNDERS, RN Outcome: Adequate for Discharge 08/28/2024 1105 by Emil Roselie SAUNDERS, RN Outcome: Adequate for Discharge Goal: Individualized Educational Video(s) 08/28/2024 1106 by Emil Roselie SAUNDERS, RN Outcome: Adequate for Discharge 08/28/2024 1105 by Emil Roselie SAUNDERS, RN Outcome: Adequate for Discharge   Problem: Coping: Goal: Ability to adjust to condition or change in health will improve 08/28/2024 1106 by Emil Roselie SAUNDERS, RN Outcome: Adequate for Discharge 08/28/2024 1105 by Emil Roselie SAUNDERS, RN Outcome: Adequate for Discharge   Problem: Fluid Volume: Goal: Ability to maintain a balanced intake and output will improve 08/28/2024 1106 by Emil Roselie SAUNDERS, RN Outcome: Adequate for Discharge 08/28/2024 1105 by Emil Roselie SAUNDERS, RN Outcome: Adequate for Discharge   Problem: Health Behavior/Discharge Planning: Goal: Ability to identify and utilize available resources and services will improve 08/28/2024 1106 by Emil Roselie SAUNDERS, RN Outcome: Adequate for Discharge 08/28/2024 1105 by Emil Roselie SAUNDERS, RN Outcome: Adequate  for Discharge Goal: Ability to manage health-related needs will improve 08/28/2024 1106 by Emil Roselie SAUNDERS, RN Outcome: Adequate for Discharge 08/28/2024 1105 by Emil Roselie SAUNDERS, RN Outcome: Adequate for Discharge   Problem: Metabolic: Goal: Ability to maintain appropriate glucose levels will improve 08/28/2024 1106 by Emil Roselie SAUNDERS, RN Outcome: Adequate for Discharge 08/28/2024 1105 by Emil Roselie SAUNDERS, RN Outcome: Adequate for Discharge   Problem: Nutritional: Goal: Maintenance of adequate nutrition will improve 08/28/2024 1106 by Emil Roselie SAUNDERS, RN Outcome: Adequate for Discharge 08/28/2024 1105 by Emil Roselie SAUNDERS, RN Outcome: Adequate for Discharge Goal: Progress toward achieving an optimal weight will improve 08/28/2024 1106 by Emil Roselie SAUNDERS, RN Outcome: Adequate for Discharge 08/28/2024 1105 by Emil Roselie SAUNDERS, RN Outcome: Adequate for Discharge   Problem: Skin Integrity: Goal: Risk for impaired skin integrity will decrease 08/28/2024 1106 by Emil Roselie SAUNDERS, RN Outcome: Adequate for Discharge 08/28/2024 1105 by Emil Roselie SAUNDERS, RN Outcome: Adequate for Discharge   Problem: Tissue Perfusion: Goal: Adequacy of tissue perfusion will improve 08/28/2024 1106 by Emil Roselie SAUNDERS, RN Outcome: Adequate for Discharge 08/28/2024 1105 by Emil Roselie SAUNDERS, RN Outcome: Adequate for Discharge

## 2024-08-28 NOTE — Discharge Summary (Signed)
 Physician Discharge Summary   Patient: Demetrice Combes MRN: [id number] DOB: [birth year]  Admit date:     08/24/2024  Discharge date: {dischdate:26783}  Discharge Physician: Concepcion Riser   PCP: Millicent Sharper, MD   Recommendations at discharge:  {Tip this will not be part of the note when signed- Example include specific recommendations for outpatient follow-up, pending tests to follow-up on. (Optional):26781}  ***  Discharge Diagnoses: Principal Problem:   Acute on chronic diastolic CHF (congestive heart failure) (HCC) Active Problems:   Exertional dyspnea   Lower extremity edema   CKD (chronic kidney disease) stage 4, GFR 15-29 ml/min (HCC)   CAD S/P percutaneous coronary angioplasty   Type 2 diabetes mellitus with vascular disease (HCC)   Anemia   Hypothyroidism   Essential hypertension   SOB (shortness of breath)  Resolved Problems:   * No resolved hospital problems. *  Hospital Course: Draper Gallon is a 89 y.o. male with history of CAD status post PCI, chronic kidney disease stage IV, diabetes mellitus type 2, hypothyroidism, hypertension presents to the ER because of shortness of breath and lower extremity edema.   Patient has been having the symptoms for almost a month.  Had followed up with her nephrologist recently at [facility] and was considering Lasix  but has not started yet.  Also was concerned that patient had not taken his Synthroid  for last few weeks due to confusion with the tablets.  D Also has some orthopnea.  2D echo done in August 2024 showed normal biventricular function with grade 1 diastolic dysfunction.   ED Course: In the ER chest x-ray was unremarkable.  On exam patient has mild edema of both lower extremities.  Troponin is 22 and proBNP is 3100 hemoglobin 11.5 COVID and flu test negative.  Assessment and Plan: No notes have been filed under this hospital service. Service: Hospitalist     {Tip this will not be part of the note when signed Body  mass index is 29.55 kg/m. , ,  (Optional):26781}  {(NOTE) Pain control PDMP Statment (Optional):26782} Consultants: *** Procedures performed: ***  Disposition: {Plan; Disposition:26390} Diet recommendation:  Discharge Diet Orders (From admission, onward)     Start     Ordered   08/28/24 0000  Diet - low sodium heart healthy        08/28/24 0936   08/28/24 0000  Diet Carb Modified        08/28/24 0936           {Diet_Plan:26776} DISCHARGE MEDICATION: Allergies as of 08/28/2024       Reactions   Nexium [esomeprazole Magnesium] Anxiety   Roxicodone  [oxycodone ] Nausea Only        Medication List     STOP taking these medications    colchicine  0.6 MG tablet       TAKE these medications    amLODipine  5 MG tablet Commonly known as: NORVASC  Take 5 mg by mouth daily.   aspirin  EC 81 MG tablet Take 81 mg by mouth daily.   CALCIUM  PO Take 1 tablet by mouth 2 (two) times daily.   carvedilol  25 MG tablet Commonly known as: COREG  Take 25 mg by mouth 2 (two) times daily.   ezetimibe  10 MG tablet Commonly known as: ZETIA  Take 10 mg by mouth daily.   famotidine  20 MG tablet Commonly known as: PEPCID  Take 20 mg by mouth 2 (two) times daily.   levothyroxine  88 MCG tablet Commonly known as: SYNTHROID  Take 88 mcg by mouth daily before breakfast.  OMEGA-3 PO Take 2 capsules by mouth every evening. OmegaXL   potassium chloride 10 MEQ tablet Commonly known as: KLOR-CON Take 10 mEq by mouth daily.   rosuvastatin  10 MG tablet Commonly known as: CRESTOR  Take 10 mg by mouth every evening.   sitaGLIPtin 25 MG tablet Commonly known as: JANUVIA Take 25 mg by mouth daily.   TURMERIC (CURCUMIN) PO Take 1 tablet by mouth every evening.        Discharge Exam: Filed Weights   08/24/24 1653 08/24/24 2248  Weight: 76.2 kg 75.7 kg   ***  Condition at discharge: {DC Condition:26389}  The results of significant diagnostics from this hospitalization  (including imaging, microbiology, ancillary and laboratory) are listed below for reference.   Imaging Studies: ECHOCARDIOGRAM COMPLETE Result Date: 08/25/2024    ECHOCARDIOGRAM REPORT   Patient Name:   JOANGEL VANOSDOL  Date of Exam: 08/25/2024 Medical Rec #:  [id number]  Height:       63.0 in Accession #:    [id number] Weight:       166.8 lb Date of Birth:  05/20/1933 BSA:          1.790 m Patient Age:    89 years   BP:           148/65 mmHg Patient Gender: M          HR:           57 bpm. Exam Location:  Inpatient Procedure: 2D Echo (Both Spectral and Color Flow Doppler were utilized during            procedure). Indications:    congestive heart failure  History:        Patient has no prior history of Echocardiogram examinations.                 CAD, chronic kidney disease, Signs/Symptoms:Edema; Risk                 Factors:Hypertension, Dyslipidemia and Diabetes.  Sonographer:    Tinnie Barefoot RDCS Referring Phys: [id number] ARSHAD N KAKRAKANDY IMPRESSIONS  1. Left ventricular ejection fraction, by estimation, is 60 to 65%. The left ventricle has normal function. The left ventricle has no regional wall motion abnormalities. Left ventricular diastolic parameters are consistent with Grade I diastolic dysfunction (impaired relaxation).  2. Right ventricular systolic function is low normal. The right ventricular size is normal. Tricuspid regurgitation signal is inadequate for assessing PA pressure.  3. The mitral valve is grossly normal. Trivial mitral valve regurgitation.  4. The aortic valve is tricuspid. Aortic valve regurgitation is not visualized. Aortic valve sclerosis/calcification is present, without any evidence of aortic stenosis.  5. The inferior vena cava is normal in size with greater than 50% respiratory variability, suggesting right atrial pressure of 3 mmHg. Comparison(s): No prior Echocardiogram. FINDINGS  Left Ventricle: Left ventricular ejection fraction, by estimation, is 60 to 65%. The left ventricle  has normal function. The left ventricle has no regional wall motion abnormalities. The left ventricular internal cavity size was normal in size. There is  no left ventricular hypertrophy. Left ventricular diastolic parameters are consistent with Grade I diastolic dysfunction (impaired relaxation). Indeterminate filling pressures. Right Ventricle: The right ventricular size is normal. No increase in right ventricular wall thickness. Right ventricular systolic function is low normal. Tricuspid regurgitation signal is inadequate for assessing PA pressure. Left [facility]: Left atrial size was normal in size. Right [facility]: Right atrial size was not well visualized. Pericardium: There is no evidence of  pericardial effusion. Mitral Valve: The mitral valve is grossly normal. Trivial mitral valve regurgitation. Tricuspid Valve: The tricuspid valve is normal in structure. Tricuspid valve regurgitation is not demonstrated. Aortic Valve: The aortic valve is tricuspid. Aortic valve regurgitation is not visualized. Aortic valve sclerosis/calcification is present, without any evidence of aortic stenosis. Pulmonic Valve: The pulmonic valve was grossly normal. Pulmonic valve regurgitation is trivial. Aorta: The aortic root and ascending aorta are structurally normal, with no evidence of dilitation. Venous: The inferior vena cava is normal in size with greater than 50% respiratory variability, suggesting right atrial pressure of 3 mmHg. IAS/Shunts: No atrial level shunt detected by color flow Doppler.  LEFT VENTRICLE PLAX 2D LVIDd:         4.40 cm   Diastology LVIDs:         2.30 cm   LV e' medial:    4.13 cm/s LV PW:         0.90 cm   LV E/e' medial:  19.2 LV IVS:        1.00 cm   LV e' lateral:   5.00 cm/s LVOT diam:     2.00 cm   LV E/e' lateral: 15.8 LV SV:         59 LV SV Index:   33 LVOT Area:     3.14 cm  RIGHT VENTRICLE             IVC RV Basal diam:  2.60 cm     IVC diam: 1.20 cm RV S prime:     10.90 cm/s TAPSE (M-mode): 1.9  cm LEFT ATRIUM             Index LA diam:        3.40 cm 1.90 cm/m LA Vol (A2C):   32.5 ml 18.16 ml/m LA Vol (A4C):   37.2 ml 20.78 ml/m LA Biplane Vol: 36.5 ml 20.39 ml/m  AORTIC VALVE LVOT Vmax:   71.70 cm/s LVOT Vmean:  48.000 cm/s LVOT VTI:    0.188 m  AORTA Ao Root diam: 3.10 cm Ao Asc diam:  3.10 cm MITRAL VALVE MV Area (PHT): 3.42 cm    SHUNTS MV Decel Time: 222 msec    Systemic VTI:  0.19 m MV E velocity: 79.20 cm/s  Systemic Diam: 2.00 cm MV A velocity: 78.50 cm/s MV E/A ratio:  1.01 Vinie Maxcy MD Electronically signed by Vinie Maxcy MD Signature Date/Time: 08/25/2024/12:05:32 PM    Final    DG Chest 2 View Result Date: 08/24/2024 CLINICAL DATA:  Shortness of breath. Cough for 1 month. Orthopnea and dyspnea on exertion. Bilateral lower extremity swelling. EXAM: CHEST - 2 VIEW COMPARISON:  03/03/2023 FINDINGS: Mild cardiac enlargement. Low lung volumes with atelectasis in the lung bases. No airspace disease or consolidation. No pleural effusion or pneumothorax. Mediastinal contours appear intact. Calcification of the aorta. Degenerative changes in the spine. IMPRESSION: Cardiac enlargement. Mild atelectasis in the lung bases. No focal consolidation. Electronically Signed   By: Elsie Gravely M.D.   On: 08/24/2024 17:38    Microbiology: Results for orders placed or performed during the hospital encounter of 08/24/24  Blood culture (routine x 2)     Status: None (Preliminary result)   Collection Time: 08/24/24  5:28 PM   Specimen: BLOOD  Result Value Ref Range Status   Specimen Description   Final    BLOOD LEFT ANTECUBITAL Performed at [facility], [address], [place], [place] [address]  Special Requests   Final    BOTTLES DRAWN AEROBIC AND ANAEROBIC Blood Culture adequate volume Performed at [facility], [address] [place], [place] [address]    Culture   Final    NO GROWTH 4 DAYS Performed at [facility], [address],  [place], [place] [address]    Report Status PENDING  Incomplete  Resp panel by RT-PCR (RSV, Flu A&B, Covid) Anterior Nasal Swab     Status: None   Collection Time: 08/24/24  5:28 PM   Specimen: Anterior Nasal Swab  Result Value Ref Range Status   SARS Coronavirus 2 by RT PCR NEGATIVE NEGATIVE Final    Comment: (NOTE) SARS-CoV-2 target nucleic acids are NOT DETECTED.  The SARS-CoV-2 RNA is generally detectable in upper respiratory specimens during the acute phase of infection. The lowest concentration of SARS-CoV-2 viral copies this assay can detect is 138 copies/mL. A negative result does not preclude SARS-Cov-2 infection and should not be used as the sole basis for treatment or other patient management decisions. A negative result may occur with  improper specimen collection/handling, submission of specimen other than nasopharyngeal swab, presence of viral mutation(s) within the areas targeted by this assay, and inadequate number of viral copies(<138 copies/mL). A negative result must be combined with clinical observations, patient history, and epidemiological information. The expected result is Negative.  Fact Sheet for Patients:  BloggerCourse.com  Fact Sheet for Healthcare Providers:  SeriousBroker.it  This test is no t yet approved or cleared by the [place]  FDA and  has been authorized for detection and/or diagnosis of SARS-CoV-2 by FDA under an Emergency Use Authorization (EUA). This EUA will remain  in effect (meaning this test can be used) for the duration of the COVID-19 declaration under Section 564(b)(1) of the Act, 21 U.S.C.section 360bbb-3(b)(1), unless the authorization is terminated  or revoked sooner.       Influenza A by PCR NEGATIVE NEGATIVE Final   Influenza B by PCR NEGATIVE NEGATIVE Final    Comment: (NOTE) The Xpert Xpress SARS-CoV-2/FLU/RSV plus assay is intended as an aid in the diagnosis of influenza  from Nasopharyngeal swab specimens and should not be used as a sole basis for treatment. Nasal washings and aspirates are unacceptable for Xpert Xpress SARS-CoV-2/FLU/RSV testing.  Fact Sheet for Patients: BloggerCourse.com  Fact Sheet for Healthcare Providers: SeriousBroker.it  This test is not yet approved or cleared by the [place]  FDA and has been authorized for detection and/or diagnosis of SARS-CoV-2 by FDA under an Emergency Use Authorization (EUA). This EUA will remain in effect (meaning this test can be used) for the duration of the COVID-19 declaration under Section 564(b)(1) of the Act, 21 U.S.C. section 360bbb-3(b)(1), unless the authorization is terminated or revoked.     Resp Syncytial Virus by PCR NEGATIVE NEGATIVE Final    Comment: (NOTE) Fact Sheet for Patients: BloggerCourse.com  Fact Sheet for Healthcare Providers: SeriousBroker.it  This test is not yet approved or cleared by the [place]  FDA and has been authorized for detection and/or diagnosis of SARS-CoV-2 by FDA under an Emergency Use Authorization (EUA). This EUA will remain in effect (meaning this test can be used) for the duration of the COVID-19 declaration under Section 564(b)(1) of the Act, 21 U.S.C. section 360bbb-3(b)(1), unless the authorization is terminated or revoked.  Performed at [facility], [address] [place], [place] [address]   Blood culture (routine x 2)     Status: Abnormal  Collection Time: 08/24/24 11:53 PM   Specimen: BLOOD RIGHT ARM  Result Value Ref Range Status   Specimen Description BLOOD RIGHT ARM  Final   Special Requests   Final    BOTTLES DRAWN AEROBIC AND ANAEROBIC Blood Culture adequate volume   Culture  Setup Time   Final    GRAM POSITIVE COCCI ANAEROBIC BOTTLE ONLY CRITICAL RESULT CALLED TO, READ BACK BY AND VERIFIED WITH: PHARMD KAREN  A [id number] FCP    Culture (A)  Final    STAPHYLOCOCCUS EPIDERMIDIS THE SIGNIFICANCE OF ISOLATING THIS ORGANISM FROM A SINGLE VENIPUNCTURE CANNOT BE PREDICTED WITHOUT FURTHER CLINICAL AND CULTURE CORRELATION. SUSCEPTIBILITIES AVAILABLE ONLY ON REQUEST. Performed at [facility], [address], [place], [place] [address]    Report Status 08/27/2024 FINAL  Final  Blood Culture ID Panel (Reflexed)     Status: Abnormal   Collection Time: 08/24/24 11:53 PM  Result Value Ref Range Status   Enterococcus faecalis NOT DETECTED NOT DETECTED Final   Enterococcus Faecium NOT DETECTED NOT DETECTED Final   Listeria monocytogenes NOT DETECTED NOT DETECTED Final   Staphylococcus species DETECTED (A) NOT DETECTED Final    Comment: CRITICAL RESULT CALLED TO, READ BACK BY AND VERIFIED WITH: PHARMD KAREN A 2144 [id number] FCP    Staphylococcus aureus (BCID) NOT DETECTED NOT DETECTED Final   Staphylococcus epidermidis DETECTED (A) NOT DETECTED Final    Comment: CRITICAL RESULT CALLED TO, READ BACK BY AND VERIFIED WITH: PHARMD KAREN A 2144 [id number] FCP    Staphylococcus lugdunensis NOT DETECTED NOT DETECTED Final   Streptococcus species NOT DETECTED NOT DETECTED Final   Streptococcus agalactiae NOT DETECTED NOT DETECTED Final   Streptococcus pneumoniae NOT DETECTED NOT DETECTED Final   Streptococcus pyogenes NOT DETECTED NOT DETECTED Final   A.calcoaceticus-baumannii NOT DETECTED NOT DETECTED Final   Bacteroides fragilis NOT DETECTED NOT DETECTED Final   Enterobacterales NOT DETECTED NOT DETECTED Final   Enterobacter cloacae complex NOT DETECTED NOT DETECTED Final   Escherichia coli NOT DETECTED NOT DETECTED Final   Klebsiella aerogenes NOT DETECTED NOT DETECTED Final   Klebsiella oxytoca NOT DETECTED NOT DETECTED Final   Klebsiella pneumoniae NOT DETECTED NOT DETECTED Final   Proteus species NOT DETECTED NOT DETECTED Final   Salmonella species NOT DETECTED NOT DETECTED Final   Serratia marcescens  NOT DETECTED NOT DETECTED Final   Haemophilus influenzae NOT DETECTED NOT DETECTED Final   Neisseria meningitidis NOT DETECTED NOT DETECTED Final   Pseudomonas aeruginosa NOT DETECTED NOT DETECTED Final   Stenotrophomonas maltophilia NOT DETECTED NOT DETECTED Final   Candida albicans NOT DETECTED NOT DETECTED Final   Candida auris NOT DETECTED NOT DETECTED Final   Candida glabrata NOT DETECTED NOT DETECTED Final   Candida krusei NOT DETECTED NOT DETECTED Final   Candida parapsilosis NOT DETECTED NOT DETECTED Final   Candida tropicalis NOT DETECTED NOT DETECTED Final   Cryptococcus neoformans/gattii NOT DETECTED NOT DETECTED Final   Methicillin resistance mecA/C NOT DETECTED NOT DETECTED Final    Comment: Performed at [facility], [address] [place], [place] [address]    Labs: CBC: Recent Labs  Lab 08/24/24 1715 08/25/24 0426 08/27/24 0528 08/28/24 0234  WBC 7.6 6.9 8.4 9.2  NEUTROABS  --  3.9  --   --   HGB 11.5* 12.1* 13.3 13.1  HCT 36.2* 37.5* 41.4 40.7  MCV 83.8 84.5 83.5 83.9  PLT 184 183 201 199   Basic Metabolic Panel: Recent Labs  Lab 08/25/24 0426  08/25/24 1551 08/25/24 1926 08/26/24 0952 08/27/24 0528 08/28/24 0234  NA 139 138  --  137 135 134*  K 4.0 4.5 3.7 4.0 4.4 3.7  CL 103 103  --  100 95* 96*  CO2 24 14*  --  22 25 23   GLUCOSE 135* 81  --  126* 128* 118*  BUN 24* 29*  --  31* 44* 48*  CREATININE 2.73* 2.81*  --  2.98* 3.20* 3.19*  CALCIUM  8.1* 8.3*  --  8.6* 8.4* 7.7*  MG  --   --  1.8  --   --   --    Liver Function Tests: Recent Labs  Lab 08/25/24 0426  AST 19  ALT 12  ALKPHOS 69  BILITOT 0.6  PROT 6.8  ALBUMIN 3.5   CBG: Recent Labs  Lab 08/27/24 0739 08/27/24 1204 08/27/24 1640 08/27/24 2102 08/28/24 0822  GLUCAP 108* 93 92 132* 203*    Discharge time spent: {LESS THAN/GREATER THAN:26388} 30 minutes.  Signed: Concepcion Riser, MD [facility] 08/28/2024

## 2024-08-28 NOTE — Progress Notes (Signed)
 Pt found sitting at the edge of the bed c/o right leg cramps and new onset upper abd stomach cramps.  Pt vomited at bedside.   Denies headache chest pain or back pain.  Charlotte RN notified and at bedside.   D/C RN placed warm compress on stomach, mouth rinse, sitting up in bed.  Alert oriented. States, he feels better and ready to go home.  Spoke with daughter via phone.  Daughter is on her way up to the room.  No PIV or Tele present during assessment.  RN at  bedside and aware.

## 2024-08-28 NOTE — TOC Transition Note (Signed)
 Transition of Care [facility]) - Discharge Note   Patient Details  Name: Juan Boyd MRN: [id number] Date of Birth: 1933-09-19  [facility] CM/SW Contact:  Roxie KANDICE Stain, RN Phone Number: 08/28/2024, 9:42 AM   Clinical Narrative:    [name] is stable to discharge home.  No TOC needs at this time.    Final next level of care: Home/Self Care Barriers to Discharge: Barriers Resolved   Patient Goals and CMS Choice Patient states their goals for this hospitalization and ongoing recovery are:: patient will return home with spouse.   Choice offered to / list presented to : NA      Discharge Placement               home        Discharge Plan and Services Additional resources added to the After Visit Summary for   In-house Referral: NA Discharge Planning Services: CM Consult Post Acute Care Choice: NA            DME Agency: NA       HH Arranged: NA          Social Drivers of Health (SDOH) Interventions SDOH Screenings   Food Insecurity: No Food Insecurity (08/24/2024)  Housing: Low Risk  (08/24/2024)  Transportation Needs: No Transportation Needs (08/24/2024)  Utilities: Not At Risk (08/24/2024)  Social Connections: Socially Integrated (08/24/2024)  Tobacco Use: Medium Risk (08/25/2024)     Readmission Risk Interventions    08/28/2024    9:41 AM  Readmission Risk Prevention Plan  Transportation Screening Complete  PCP or Specialist Appt within 5-7 Days Complete  Home Care Screening Complete  Medication Review (RN CM) Complete

## 2024-08-29 LAB — CULTURE, BLOOD (ROUTINE X 2)
Culture: NO GROWTH
Special Requests: ADEQUATE
